# Patient Record
Sex: Female | Born: 1985 | Race: Black or African American | Hispanic: No | Marital: Single | Smoking: Current every day smoker
Health system: Southern US, Community
[De-identification: ages and names within clinical notes are randomized; demographics above are authoritative.]

## PROBLEM LIST (undated history)

## (undated) DIAGNOSIS — D649 Anemia, unspecified: Secondary | ICD-10-CM

## (undated) DIAGNOSIS — Z8742 Personal history of other diseases of the female genital tract: Secondary | ICD-10-CM

## (undated) DIAGNOSIS — Z8669 Personal history of other diseases of the nervous system and sense organs: Secondary | ICD-10-CM

## (undated) DIAGNOSIS — S82892A Other fracture of left lower leg, initial encounter for closed fracture: Secondary | ICD-10-CM

## (undated) DIAGNOSIS — S82891A Other fracture of right lower leg, initial encounter for closed fracture: Secondary | ICD-10-CM

## (undated) HISTORY — DX: Personal history of other diseases of the nervous system and sense organs: Z86.69

## (undated) HISTORY — DX: Anemia, unspecified: D64.9

---
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
## 2012-05-07 DIAGNOSIS — S82892A Other fracture of left lower leg, initial encounter for closed fracture: Secondary | ICD-10-CM

## 2012-05-07 HISTORY — DX: Other fracture of left lower leg, initial encounter for closed fracture: S82.892A

## 2016-05-07 DIAGNOSIS — Z8742 Personal history of other diseases of the female genital tract: Secondary | ICD-10-CM

## 2016-05-07 HISTORY — DX: Personal history of other diseases of the female genital tract: Z87.42

## 2017-06-07 DIAGNOSIS — S82891A Other fracture of right lower leg, initial encounter for closed fracture: Secondary | ICD-10-CM

## 2017-06-07 HISTORY — DX: Other fracture of right lower leg, initial encounter for closed fracture: S82.891A

## 2017-09-09 ENCOUNTER — Emergency Department: Payer: Medicaid Other

## 2017-09-09 ENCOUNTER — Encounter: Payer: Self-pay | Admitting: Emergency Medicine

## 2017-09-09 ENCOUNTER — Emergency Department
Admission: EM | Admit: 2017-09-09 | Discharge: 2017-09-09 | Disposition: A | Discharge status: Home / Self Care | Payer: Medicaid Other | Attending: Emergency Medicine | Admitting: Emergency Medicine

## 2017-09-09 DIAGNOSIS — Y998 Other external cause status: Secondary | ICD-10-CM | POA: Insufficient documentation

## 2017-09-09 DIAGNOSIS — S99911A Unspecified injury of right ankle, initial encounter: Secondary | ICD-10-CM | POA: Diagnosis present

## 2017-09-09 DIAGNOSIS — Y929 Unspecified place or not applicable: Secondary | ICD-10-CM | POA: Diagnosis not present

## 2017-09-09 DIAGNOSIS — Y9351 Activity, roller skating (inline) and skateboarding: Secondary | ICD-10-CM | POA: Insufficient documentation

## 2017-09-09 DIAGNOSIS — S82831A Other fracture of upper and lower end of right fibula, initial encounter for closed fracture: Secondary | ICD-10-CM | POA: Insufficient documentation

## 2017-09-09 DIAGNOSIS — W010XXA Fall on same level from slipping, tripping and stumbling without subsequent striking against object, initial encounter: Secondary | ICD-10-CM | POA: Insufficient documentation

## 2017-09-09 MED ORDER — IBUPROFEN 800 MG PO TABS: 800.0000 mg | ORAL_TABLET | Freq: Three times a day (TID) | ORAL | 0 refills | Status: DC | PRN

## 2017-09-09 NOTE — ED Notes (Signed)
See triage note  Presents with pain and swelling to right ankle   States she twisted ankle about 2 week ago  conts to have pain  Good pulses  No deformity noted

## 2017-09-09 NOTE — ED Triage Notes (Signed)
Pt reports sprained her right ankle a couple of weeks ago skating and now both of her heels hurt. Pt reports hurts bad when she gets up from sitting to start walking on the heel of both of her feet. Denies recent injuries.

## 2017-09-09 NOTE — ED Provider Notes (Signed)
[facility] Emergency Department Provider Note  ____________________________________________  Time seen: Approximately 12:32 PM  I have reviewed the triage vital signs and the nursing notes.   HISTORY  Chief Complaint Ankle Pain    HPI Nicole Craig is a 32 y.o. female  that presents to the emergency department for evaluation of right ankle pain for 2 weeks after falling while skating.  Patient was not evaluated after fall because she had to work.  Pain is primarily on the outside of her ankle but radiates into her heels.  No numbness, tingling.  History reviewed. No pertinent past medical history.  There are no active problems to display for this patient.   History reviewed. No pertinent surgical history.  Prior to Admission medications   Medication Sig Start Date End Date Taking? Authorizing Provider  ibuprofen (ADVIL,MOTRIN) 800 MG tablet Take 1 tablet (800 mg total) by mouth every 8 (eight) hours as needed. 09/09/17   Enid Derry, PA-C    Allergies Patient has no known allergies.  No family history on file.  Social History Social History   Tobacco Use  . Smoking status: Not on file  Substance Use Topics  . Alcohol use: Not on file  . Drug use: Not on file     Review of Systems  Cardiovascular: No chest pain. Respiratory: No SOB. Gastrointestinal: No abdominal pain.  No nausea, no vomiting.  Musculoskeletal: Positive for ankle pain.  Skin: Negative for rash, abrasions, lacerations, ecchymosis. Neurological: Negative for numbness or tingling   ____________________________________________   PHYSICAL EXAM:  VITAL SIGNS: ED Triage Vitals [09/09/17 1053]  Enc Vitals Group     BP 114/76     Pulse Rate 69     Resp 20     Temp 98.1 F (36.7 C)     Temp Source Oral     SpO2 100 %     Weight 215 lb (97.5 kg)     Height  (1.626 m)     Head Circumference      Peak Flow      Pain Score 7     Pain Loc      Pain Edu?       Excl. in GC?      Constitutional: Alert and oriented. Well appearing and in no acute distress. Eyes: Conjunctivae are normal. PERRL. EOMI. Head: Atraumatic. ENT:      Ears:      Nose: No congestion/rhinnorhea.      Mouth/Throat: Mucous membranes are moist.  Neck: No stridor.  Cardiovascular: Normal rate, regular rhythm.  Good peripheral circulation. Respiratory: Normal respiratory effort without tachypnea or retractions. Lungs CTAB. Good air entry to the bases with no decreased or absent breath sounds. Musculoskeletal: Full range of motion to all extremities. No gross deformities appreciated.  Tenderness to palpation of her right lateral malleolus.  No swelling or erythema. Neurologic:  Normal speech and language. No gross focal neurologic deficits are appreciated.  Skin:  Skin is warm, dry and intact. No rash noted. Psychiatric: Mood and affect are normal. Speech and behavior are normal. Patient exhibits appropriate insight and judgement.   ____________________________________________   LABS (all labs ordered are listed, but only abnormal results are displayed)  Labs Reviewed - No data to display ____________________________________________  EKG   ____________________________________________  RADIOLOGY Lexine Baton, personally viewed and evaluated these images (plain radiographs) as part of my medical decision making, as well as reviewing the written report by the radiologist.  Dg Ankle  Complete Right  Result Date: 09/09/2017 CLINICAL DATA:  Sprained ankle couple weeks ago.  Both heels hurt. EXAM: RIGHT ANKLE - COMPLETE 3+ VIEW COMPARISON:  None. FINDINGS: There is a fracture through the distal tip of the fibula, likely an avulsion injury from recent sprain. No other fractures. Specifically, the calcaneus maintains its normal shape. Soft tissue swelling identified. IMPRESSION: A fracture off the distal tip of the fibula is likely an avulsion injury and appears to be  acute to subacute. There is soft tissue swelling. No other abnormalities. Electronically Signed   By: Gerome Sam III M.D   On: 09/09/2017 11:50    ____________________________________________    PROCEDURES  Procedure(s) performed:    Procedures    Medications - No data to display   ____________________________________________   INITIAL IMPRESSION / ASSESSMENT AND PLAN / ED COURSE  Pertinent labs & imaging results that were available during my care of the patient were reviewed by me and considered in my medical decision making (see chart for details).  Review of the [place] CSRS was performed in accordance of the [facility] prior to dispensing any controlled drugs.   Patient presented to emergency department for evaluation of ankle pain for 2 weeks after injury.  Patient's diagnosis is consistent with avulsion fracture of the distal fibula.  Vital signs and exam are reassuring.  X-ray consistent with acute or subacute fracture.  Splint was placed.  Crutches were given.  Patient is to follow up with orthopedics as directed. Patient is given ED precautions to return to the ED for any worsening or new symptoms.     ____________________________________________  FINAL CLINICAL IMPRESSION(S) / ED DIAGNOSES  Final diagnoses:  Other closed fracture of distal end of right fibula, initial encounter      NEW MEDICATIONS STARTED DURING THIS VISIT:  ED Discharge Orders        Ordered    ibuprofen (ADVIL,MOTRIN) 800 MG tablet  Every 8 hours PRN     09/09/17 1259          This chart was dictated using voice recognition software/Dragon. Despite best efforts to proofread, errors can occur which can change the meaning. Any change was purely unintentional.    Enid Derry, PA-C 09/09/17 1553    Merrily Brittle, MD 09/09/17 [phone]

## 2018-01-25 ENCOUNTER — Emergency Department: Payer: Medicaid Other

## 2018-01-25 ENCOUNTER — Other Ambulatory Visit: Payer: Self-pay

## 2018-01-25 ENCOUNTER — Encounter: Payer: Self-pay | Admitting: Emergency Medicine

## 2018-01-25 ENCOUNTER — Emergency Department
Admission: EM | Admit: 2018-01-25 | Discharge: 2018-01-25 | Disposition: A | Discharge status: Home / Self Care | Payer: Medicaid Other | Attending: Emergency Medicine | Admitting: Emergency Medicine

## 2018-01-25 DIAGNOSIS — F17210 Nicotine dependence, cigarettes, uncomplicated: Secondary | ICD-10-CM | POA: Insufficient documentation

## 2018-01-25 DIAGNOSIS — L03211 Cellulitis of face: Secondary | ICD-10-CM

## 2018-01-25 DIAGNOSIS — Z98818 Other dental procedure status: Secondary | ICD-10-CM | POA: Diagnosis present

## 2018-01-25 LAB — BASIC METABOLIC PANEL
ANION GAP: 10 (ref 5–15)
BUN: 11 mg/dL (ref 6–20)
CALCIUM: 8.8 mg/dL — AB (ref 8.9–10.3)
CO2: 26 mmol/L (ref 22–32)
Chloride: 102 mmol/L (ref 98–111)
Creatinine, Ser: 0.72 mg/dL (ref 0.44–1.00)
GFR calc Af Amer: >60 mL/min (ref >60)
GFR calc non Af Amer: >60 mL/min (ref >60)
Glucose, Bld: 100 mg/dL — ABNORMAL HIGH (ref 70–99)
POTASSIUM: 3.9 mmol/L (ref 3.5–5.1)
Sodium: 138 mmol/L (ref 135–145)

## 2018-01-25 LAB — CBC WITH DIFFERENTIAL/PLATELET
BASOS ABS: 0 10*3/uL (ref 0–0.1)
Basophils Relative: 0 %
Eosinophils Absolute: 0.2 10*3/uL (ref 0–0.7)
Eosinophils Relative: 2 %
HEMATOCRIT: 38.1 % (ref 35.0–47.0)
Hemoglobin: 12.7 g/dL (ref 12.0–16.0)
LYMPHS ABS: 1.6 10*3/uL (ref 1.0–3.6)
LYMPHS PCT: 18 %
MCH: 26.7 pg (ref 26.0–34.0)
MCHC: 33.4 g/dL (ref 32.0–36.0)
MCV: 80 fL (ref 80.0–100.0)
MONO ABS: 0.7 10*3/uL (ref 0.2–0.9)
Monocytes Relative: 8 %
NEUTROS ABS: 6.6 10*3/uL — AB (ref 1.4–6.5)
Neutrophils Relative %: 72 %
Platelets: 181 10*3/uL (ref 150–440)
RBC: 4.76 MIL/uL (ref 3.80–5.20)
RDW: 15.2 % — AB (ref 11.5–14.5)
WBC: 9.1 10*3/uL (ref 3.6–11.0)

## 2018-01-25 LAB — POCT PREGNANCY, URINE: Preg Test, Ur: NEGATIVE

## 2018-01-25 MED ORDER — CLINDAMYCIN HCL 300 MG PO CAPS: 300.0000 mg | ORAL_CAPSULE | Freq: Three times a day (TID) | ORAL | 0 refills | Status: AC

## 2018-01-25 MED ORDER — OXYCODONE-ACETAMINOPHEN 5-325 MG PO TABS: 1.0000 | ORAL_TABLET | ORAL | 0 refills | Status: DC | PRN

## 2018-01-25 MED ORDER — IOHEXOL 300 MG/ML  SOLN
75.0000 mL | Freq: Once | INTRAMUSCULAR | Status: AC | PRN
  Administered 2018-01-25: 75 mL via INTRAVENOUS

## 2018-01-25 MED ORDER — PREDNISONE 20 MG PO TABS: 60.0000 mg | ORAL_TABLET | Freq: Every day | ORAL | 0 refills | Status: DC

## 2018-01-25 MED ORDER — CLINDAMYCIN PHOSPHATE 600 MG/50ML IV SOLN
600.0000 mg | Freq: Once | INTRAVENOUS | Status: AC
  Administered 2018-01-25: 600 mg via INTRAVENOUS
  Filled 2018-01-25 (×2): qty 50

## 2018-01-25 MED ORDER — DEXAMETHASONE SODIUM PHOSPHATE 10 MG/ML IJ SOLN
10.0000 mg | Freq: Once | INTRAMUSCULAR | Status: AC
  Administered 2018-01-25: 10 mg via INTRAVENOUS
  Filled 2018-01-25: qty 1

## 2018-01-25 NOTE — ED Notes (Signed)
Obvious swelling noted to left side of face. Maintaining airway at this time.

## 2018-01-25 NOTE — ED Triage Notes (Signed)
FIRST NURSE NOTE-had wisdom teeth removed yesterday. Significant swelling to face. +chills and dizziness. No abx from dental surgery. Handling secretions.

## 2018-01-25 NOTE — ED Notes (Signed)
Green blood tube recollect sent to lab. Pending analysis.

## 2018-01-25 NOTE — ED Notes (Signed)
Will monitor pt shortly following injection for adverse reactions. Will d/c home following.

## 2018-01-25 NOTE — ED Provider Notes (Signed)
[facility] Emergency Department Provider Note  ____________________________________________   I have reviewed the triage vital signs and the nursing notes. Where available I have reviewed prior notes and, if possible and indicated, outside hospital notes.    HISTORY  Chief Complaint Facial Swelling    HPI Nicole Craig is a 32 y.o. female who is healthy, [facility] [place] to have her wisdom teeth pulled out.  She had a pulled out, she is had some mild swelling since that time the swelling got worse overnight.  Has not been able to get in touch with oral maxillofacial surgeon, and states that she cannot get in touch with them on the weekend.  She denies any swelling under her tongue she denies any airway issues, but the swelling is getting worse and it is pushing her cheek up towards her eye.  She denies any vomiting she denies any fever or other associated symptoms is worse when she touches it nothing makes it better, no other alleviating or aggravating symptoms no other prior intervention except for Motrin.     History reviewed. No pertinent past medical history.  There are no active problems to display for this patient.   History reviewed. No pertinent surgical history.  Prior to Admission medications   Medication Sig Start Date End Date Taking? Authorizing Provider  ibuprofen (ADVIL,MOTRIN) 800 MG tablet Take 1 tablet (800 mg total) by mouth every 8 (eight) hours as needed. 09/09/17   Enid DerryWagner, Ashley, PA-C    Allergies Patient has no known allergies.  No family history on file.  Social History Social History   Tobacco Use  . Smoking status: Current Every Day Smoker    Packs/day: 0.25    Types: Cigarettes  . Smokeless tobacco: Never Used  Substance Use Topics  . Alcohol use: Not on file  . Drug use: Not on file    Review of Systems Constitutional: No fever/chills Eyes: No  visual changes. ENT: No sore throat. No stiff neck no neck pain Cardiovascular: Denies chest pain. Respiratory: Denies shortness of breath. Gastrointestinal:   no vomiting.  No diarrhea.  No constipation. Genitourinary: Negative for dysuria. Musculoskeletal: Negative lower extremity swelling Skin: Negative for rash. Neurological: Negative for severe headaches, focal weakness or numbness.   ____________________________________________   PHYSICAL EXAM:  VITAL SIGNS: ED Triage Vitals  Enc Vitals Group     BP 01/25/18 1117 (!) 112/57     Pulse Rate 01/25/18 1117 84     Resp 01/25/18 1117 18     Temp 01/25/18 1117 99 F (37.2 C)     Temp Source 01/25/18 1117 Oral     SpO2 01/25/18 1117 97 %     Weight 01/25/18 1117 213 lb (96.6 kg)     Height 01/25/18 1117 5\' 4"  (1.626 m)     Head Circumference --      Peak Flow --      Pain Score 01/25/18 1121 8     Pain Loc --      Pain Edu? --      Excl. in GC? --     Constitutional: Alert and oriented. Well appearing and in no acute distress. Eyes: Conjunctivae are normal Head: Atraumatic HEENT: No congestion/rhinnorhea. Mucous membranes are moist.  Oropharynx non-erythematous there is swelling to the left cheek, which goes up to but does not include the orbits.  It is not erythematous but it is tender to touch.  There  is no fluctuance or induration that I can palpate.  Is no swelling underneath the tongue, there is no hoarse voice there is no difficulty swallowing there is no stridor or difficulty with respiration, oropharynx is clear.   There is some mild induration but no fluctuance noted to palpation of the left cheek near the mandibular angle. Neck:   Nontender with no meningismus, no masses, no stridor Cardiovascular: Normal rate, regular rhythm. Grossly normal heart sounds.  Good peripheral circulation. Respiratory: Normal respiratory effort.  No retractions. Lungs CTAB. Abdominal: Soft and nontender. No distention. No guarding no  rebound Back:  There is no focal tenderness or step off.  there is no midline tenderness there are no lesions noted. there is no CVA tenderness Musculoskeletal: No lower extremity tenderness, no upper extremity tenderness. No joint effusions, no DVT signs strong distal pulses no edema Neurologic:  Normal speech and language. No gross focal neurologic deficits are appreciated.  Skin:  Skin is warm, dry and intact. No rash noted. Psychiatric: Mood and affect are normal. Speech and behavior are normal.  ____________________________________________   LABS (all labs ordered are listed, but only abnormal results are displayed)  Labs Reviewed - No data to display  Pertinent labs  results that were available during my care of the patient were reviewed by me and considered in my medical decision making (see chart for details). ____________________________________________  EKG  I personally interpreted any EKGs ordered by me or triage  ____________________________________________  RADIOLOGY  Pertinent labs & imaging results that were available during my care of the patient were reviewed by me and considered in my medical decision making (see chart for details). If possible, patient and/or family made aware of any abnormal findings.  No results found. ____________________________________________    PROCEDURES  Procedure(s) performed: None  Procedures  Critical Care performed: None  ____________________________________________   INITIAL IMPRESSION / ASSESSMENT AND PLAN / ED COURSE  Pertinent labs & imaging results that were available during my care of the patient were reviewed by me and considered in my medical decision making (see chart for details).  And is here with swelling after an outpatient procedure.  I have tried calling the [facility], where this was done, as this is a complication of their procedure.  I cannot find any after hours number for the surgeons and  their number go straight to a voicemail box with no indication of when phone calls will be returned.  Did leave message at 29; there is nothing on their voicemail about after hours coverage.  Do not see any evidence on their website of after hours coverage either, therefore, we will continue to treat the patient without guidance from the actual surgeon who did the procedure, unless they call.  Patient postop swelling after her wisdom teeth being pulled out.  No evidence of systemic illness.  She is nontoxic in appearance with no evidence of high fevers or systemic illness, she is not known to be diabetic.  Given the postoperative infection complaint, we will start her on clindamycin. There is no evidence of Ludwig's or airway involvement.  There is some mild induration we will see if a CT betrays anything that requires drainage, and we will reassess.  ----------------------------------------- 4:18 PM on 01/25/2018 -----------------------------------------  Apparently not possible to get on the phone the people who actually did this procedure on the weekend, I did discuss with Dr. Genevive Bi of ENT, I very much appreciate the consult we discussed the patient's findings including CT  findings.  He agrees with steroids, antibiotics and close follow-up with the ENT on Monday which we have explained to the patient.  She will bring the results of the CT scan with her which I have provided her.  Return precautions follow-up given understood.  Also try to talk to oral maxillofacial surgery at [facility] given that some of the members of the practice that performed the procedure on this patient are at [facility], however, instead I got ENT as oral maxillofacial surgery was "not on for face".  We therefore deferred further consultation and will discharge the patient who is in no acute distress at this time    ____________________________________________   FINAL CLINICAL IMPRESSION(S) / ED DIAGNOSES  Final diagnoses:  None       This chart was dictated using voice recognition software.  Despite best efforts to proofread,  errors can occur which can change meaning.      Jeanmarie Plant, MD 01/25/18 1620

## 2018-01-25 NOTE — ED Notes (Signed)
Pending consult phone call back per MD. Will d/c once given orders.

## 2018-01-25 NOTE — Discharge Instructions (Addendum)
Take antibiotics as directed and the pain medications as needed, follow closely with the oral maxillofacial surgeons who performed the procedure on you.  Please let them know that there were the following outcomes from the procedure as seen on CT scan (see below); inform them that you had to go to the emergency room over the weekend in the emergency room and that the ER staff were  unable to contact any of the physicians who work in their practice and they could not find an after hours number listed, please inform them that it would be good idea to send you home with an after hours number if they have one, especially after doing a procedure on you.   My suggestion is that you go directly there on Monday morning for reevaluation.  Please bring this piece of paper with you and show it to the physician.  IMPRESSION: BILATERAL lateral and posterior wall maxillary fractures both at the site of maxillary wisdom tooth extraction, as well as more superiorly involving the maxillary sinuses.   Slight cortical dehiscence of the medial LEFT mandible, but not the RIGHT.   Fluid and air in the retroantral soft tissues, greater on the LEFT. Facial soft tissue swelling without abscess. Air-fluid level LEFT maxillary sinus.  Take the antibiotics until they are gone, do not drive on pain medications.   Radiodense abnormalities in the anterior lower face and lip, are suspected to be unrelated, and are consistent with the appearance of someone who has had lip Fillers.

## 2018-01-25 NOTE — ED Triage Notes (Signed)
Pt arrived via POV with reports of swelling to left side of the face, pt had wisdom teeth pulled on Wednesday.  Pt noticed the swelling worse today since yesterday.  Pt states the side of her face was swollen yesterday, but this morning more swelling noted to around the eye.

## 2018-04-17 ENCOUNTER — Emergency Department
Admission: EM | Admit: 2018-04-17 | Discharge: 2018-04-17 | Disposition: A | Discharge status: Home / Self Care | Payer: Medicaid Other | Attending: Emergency Medicine | Admitting: Emergency Medicine

## 2018-04-17 ENCOUNTER — Other Ambulatory Visit: Payer: Self-pay

## 2018-04-17 ENCOUNTER — Encounter: Payer: Self-pay | Admitting: Emergency Medicine

## 2018-04-17 DIAGNOSIS — O99331 Smoking (tobacco) complicating pregnancy, first trimester: Secondary | ICD-10-CM | POA: Insufficient documentation

## 2018-04-17 DIAGNOSIS — O9989 Other specified diseases and conditions complicating pregnancy, childbirth and the puerperium: Secondary | ICD-10-CM | POA: Diagnosis present

## 2018-04-17 DIAGNOSIS — Z3A01 Less than 8 weeks gestation of pregnancy: Secondary | ICD-10-CM | POA: Diagnosis not present

## 2018-04-17 DIAGNOSIS — O210 Mild hyperemesis gravidarum: Secondary | ICD-10-CM

## 2018-04-17 DIAGNOSIS — F17210 Nicotine dependence, cigarettes, uncomplicated: Secondary | ICD-10-CM | POA: Insufficient documentation

## 2018-04-17 LAB — URINALYSIS, COMPLETE (UACMP) WITH MICROSCOPIC
Bacteria, UA: NONE SEEN
Bilirubin Urine: NEGATIVE
GLUCOSE, UA: NEGATIVE mg/dL
Hgb urine dipstick: NEGATIVE
KETONES UR: 80 mg/dL — AB
Leukocytes, UA: NEGATIVE
NITRITE: NEGATIVE
PH: 6 (ref 5.0–8.0)
Protein, ur: NEGATIVE mg/dL
Specific Gravity, Urine: 1.023 (ref 1.005–1.030)

## 2018-04-17 MED ORDER — SODIUM CHLORIDE 0.9 % IV BOLUS
1000.0000 mL | Freq: Once | INTRAVENOUS | Status: AC
  Administered 2018-04-17: 1000 mL via INTRAVENOUS

## 2018-04-17 MED ORDER — ONDANSETRON 4 MG PO TBDP: 4.0000 mg | ORAL_TABLET | Freq: Three times a day (TID) | ORAL | 0 refills | Status: DC | PRN

## 2018-04-17 MED ORDER — ONDANSETRON HCL 4 MG/2ML IJ SOLN
4.0000 mg | Freq: Once | INTRAMUSCULAR | Status: AC
  Administered 2018-04-17: 4 mg via INTRAVENOUS
  Filled 2018-04-17: qty 2

## 2018-04-17 NOTE — ED Notes (Signed)
Patient going to restroom to give urine sample at this time

## 2018-04-17 NOTE — ED Provider Notes (Signed)
[facility] Emergency Department Provider Note  ____________________________________________   None    (approximate)  I have reviewed the triage vital signs and the nursing notes.   HISTORY  Chief Complaint Morning Sickness and Abdominal Cramping   HPI Nicole Craig is a 32 y.o. female presents to the ED via EMS with history of nausea and vomiting.  Patient is [redacted] weeks pregnant.  She complains of dizziness along with "hot flashes".  She is also had some abdominal cramping but no vaginal bleeding or discomfort.  She is scheduled for a [facility] next [facility].  She states that she has vomited approximately 6 times in 24 hours.  This is her sixth pregnancy.  She rates her pain as 2 out of 10.  History reviewed. No pertinent past medical history.  There are no active problems to display for this patient.   History reviewed. No pertinent surgical history.  Prior to Admission medications   Medication Sig Start Date End Date Taking? Authorizing Provider  ondansetron (ZOFRAN ODT) 4 MG disintegrating tablet Take 1 tablet (4 mg total) by mouth every 8 (eight) hours as needed for nausea or vomiting. 04/17/18   Tommi RumpsSummers, Rhonda L, PA-C    Allergies Patient has no known allergies.  No family history on file.  Social History Social History   Tobacco Use  . Smoking status: Current Every Day Smoker    Packs/day: 0.25    Types: Cigarettes  . Smokeless tobacco: Never Used  Substance Use Topics  . Alcohol use: Not on file  . Drug use: Not on file    Review of Systems Constitutional: No fever/chills Eyes: No visual changes.  Positive for dizziness. ENT: No sore throat. Cardiovascular: Denies chest pain. Respiratory: Denies shortness of breath. Gastrointestinal: Positive abdominal cramping.  Positive nausea, positive vomiting.  No diarrhea.  Genitourinary: Negative for dysuria. Musculoskeletal: Negative for back  pain. Skin: Negative for rash. Neurological: Negative for headaches, focal weakness or numbness. ____________________________________________   PHYSICAL EXAM:  VITAL SIGNS: ED Triage Vitals  Enc Vitals Group     BP 04/17/18 0859 109/61     Pulse Rate 04/17/18 0859 (!) 110     Resp 04/17/18 0859 20     Temp 04/17/18 0859 98.5 F (36.9 C)     Temp Source 04/17/18 0859 Oral     SpO2 04/17/18 0859 100 %     Weight 04/17/18 0900 213 lb (96.6 kg)     Height 04/17/18 0900 5\' 3"  (1.6 m)     Head Circumference --      Peak Flow --      Pain Score 04/17/18 0900 2     Pain Loc --      Pain Edu? --      Excl. in GC? --    Constitutional: Alert and oriented. Well appearing and in no acute distress. Eyes: Conjunctivae are normal.  Head: Atraumatic. Nose: No congestion/rhinnorhea. Mouth/Throat: Mucous membranes are moist.  Oropharynx non-erythematous. Neck: No stridor.   Hematological/Lymphatic/Immunilogical: No cervical lymphadenopathy. Cardiovascular: Normal rate, regular rhythm. Grossly normal heart sounds.  Good peripheral circulation. Respiratory: Normal respiratory effort.  No retractions. Lungs CTAB. Gastrointestinal: Soft and nontender. No distention.  Bowel sounds are normoactive x4 quadrants. Musculoskeletal: Moves upper and lower extremities without any difficulty.  Patient is able to stand without any assistance.  No joint swelling is present. Neurologic:  Normal speech and language. No gross focal neurologic deficits are appreciated. No gait instability. Skin:  Skin is warm, dry and intact. No rash noted. Psychiatric: Mood and affect are normal. Speech and behavior are normal.  ____________________________________________   LABS (all labs ordered are listed, but only abnormal results are displayed)  Labs Reviewed  URINALYSIS, COMPLETE (UACMP) WITH MICROSCOPIC - Abnormal; Notable for the following components:      Result Value   Color, Urine YELLOW (*)    APPearance  CLEAR (*)    Ketones, ur 80 (*)    All other components within normal limits   PROCEDURES  Procedure(s) performed: None  Procedures  Critical Care performed: No  ____________________________________________   INITIAL IMPRESSION / ASSESSMENT AND PLAN / ED COURSE  As part of my medical decision making, I reviewed the following data within the electronic MEDICAL RECORD NUMBER Notes from prior ED visits and [place] Controlled Substance Database  Patient presents to the ED with complaint of vomiting being [redacted] weeks pregnant.  Patient denies any vaginal discharge or bleeding.  She complains of some abdominal cramping and has been having dry heaves.  She also feels dizzy and "hot flashes".  Patient is scheduled for her prenatal visit at the health department next week.  Patient was noted to be spitting into an emesis bag but was never seen actually vomiting.  She was given normal saline 1 L after orthostatic vital signs were taken.  Zofran IV was given and patient improved.  At the time of discharge patient was improved with nausea.  She was given a prescription for Zofran to continue and encouraged to discontinue any greasy, fried, spicy foods at this time.  She is encouraged also to keep her appointment with the health department.  ____________________________________________   FINAL CLINICAL IMPRESSION(S) / ED DIAGNOSES  Final diagnoses:  Hyperemesis arising during pregnancy     ED Discharge Orders         Ordered    ondansetron (ZOFRAN ODT) 4 MG disintegrating tablet  Every 8 hours PRN     04/17/18 1144           Note:  This document was prepared using Dragon voice recognition software and may include unintentional dictation errors.    Tommi Rumps, PA-C 04/17/18 1656    Schaevitz, Myra Rude, MD 04/18/18 [phone]

## 2018-04-17 NOTE — Discharge Instructions (Signed)
Keep your point with the help apartment next week.  Begin taking Zofran 1 every 8 hours as needed for nausea.  You may also drink ginger ale which may calm your stomach.  Take small sips of clear liquids.  Avoid fatty greasy foods or fried foods at this time.

## 2018-04-17 NOTE — ED Triage Notes (Signed)
Presents via EMS   States she is 6 weeks preg and is having some n/v   Vomiting started yesterday  But now having dry heaves  Also has been dizzy and having some "hot flashes"  States she is also having some abd cramping w/o bleeding  scheduled to got to [facility] next for [facility] care

## 2018-04-17 NOTE — ED Notes (Signed)
Unable to stand and have B/P taken. She was to light headed and nauseated. Third B/P check she was back laying down.  111/82, H/R 88

## 2018-04-22 LAB — OB RESULTS CONSOLE VARICELLA ZOSTER ANTIBODY, IGG: Varicella: IMMUNE

## 2018-04-22 LAB — TSH: TSH: 0.53 (ref 0.41–5.90)

## 2018-04-22 LAB — OB RESULTS CONSOLE RUBELLA ANTIBODY, IGM: Rubella: IMMUNE

## 2018-04-22 LAB — BASIC METABOLIC PANEL: Creatinine: 0.5 (ref 0.5–1.1)

## 2018-04-22 LAB — OB RESULTS CONSOLE HIV ANTIBODY (ROUTINE TESTING): HIV: NONREACTIVE

## 2018-04-22 LAB — HEPATIC FUNCTION PANEL: AST: 15 (ref 13–35)

## 2018-04-22 LAB — CBC AND DIFFERENTIAL: Hemoglobin: 12.1 (ref 12.0–16.0)

## 2018-04-23 LAB — OB RESULTS CONSOLE ABO/RH: RH Type: POSITIVE

## 2018-04-23 LAB — OB RESULTS CONSOLE ANTIBODY SCREEN: Antibody Screen: NEGATIVE

## 2018-04-23 LAB — OB RESULTS CONSOLE HGB/HCT, BLOOD: HCT: 37 (ref 29–41)

## 2018-04-23 LAB — OB RESULTS CONSOLE HEPATITIS B SURFACE ANTIGEN: Hepatitis B Surface Ag: NEGATIVE

## 2018-04-23 LAB — OB RESULTS CONSOLE PLATELET COUNT: Platelets: 186

## 2018-04-23 LAB — OB RESULTS CONSOLE RPR: RPR: NONREACTIVE

## 2018-04-24 ENCOUNTER — Other Ambulatory Visit: Payer: Self-pay | Admitting: Nurse Practitioner

## 2018-04-24 DIAGNOSIS — Z369 Encounter for antenatal screening, unspecified: Secondary | ICD-10-CM

## 2018-04-24 LAB — OB RESULTS CONSOLE GC/CHLAMYDIA
Chlamydia: NEGATIVE
Gonorrhea: NEGATIVE

## 2018-05-07 NOTE — L&D Delivery Note (Signed)
Delivery Note At 3:05 AM a viable and healthy female "Nicole Craig" was delivered via Vaginal, Spontaneous (Presentation:OA ).  APGAR: 9, 9; weight pending .   Placenta status: expressed.  Cord: 3VC with the following complications: nuchal x1, loose  Anesthesia:  epidural Episiotomy: None Lacerations: None Est. Blood Loss (mL):  100  Mom to postpartum.  Baby to Couplet care / Skin to Skin.  33yo [id number] at 40+1wks with iol at term, elective. She was AROM for clear fluid x8.5hrs. Deep repetitive variable with contractions with reassuringly moderate variability and accels, but conservative measures did not persistently work. Pitocin was turned off and on intermittently. She progressed to anterior lip, which was able to be reduced. Pt pushed from a -1 station to +3 in just a few contractions, and then delivered over an intact perineum. Cord was reduced on the perineum. Baby was immediately vigorous and placed on maternal abdomen. Delayed cord clamping and grandmother cut the cord. Placenta expressed. Trailing membranes teased out from firm attachment at the fundus. Bleeding minimal. No tears. Postpartum pit given. Patient tolerated well.  Benjaman Kindler 11/20/2018, 3:30 AM

## 2018-05-15 ENCOUNTER — Ambulatory Visit (HOSPITAL_BASED_OUTPATIENT_CLINIC_OR_DEPARTMENT_OTHER)
Admission: RE | Admit: 2018-05-15 | Discharge: 2018-05-15 | Disposition: A | Discharge status: Home / Self Care | Payer: Medicaid Other | Source: Ambulatory Visit | Attending: Obstetrics and Gynecology | Admitting: Obstetrics and Gynecology

## 2018-05-15 ENCOUNTER — Ambulatory Visit
Admission: RE | Admit: 2018-05-15 | Discharge: 2018-05-15 | Disposition: A | Discharge status: Home / Self Care | Payer: Medicaid Other | Source: Ambulatory Visit | Attending: Obstetrics and Gynecology | Admitting: Obstetrics and Gynecology

## 2018-05-15 ENCOUNTER — Inpatient Hospital Stay: Payer: Medicaid Other | Attending: Oncology | Admitting: Oncology

## 2018-05-15 VITALS — BP 120/70 | HR 76 | Temp 98.1°F | Resp 18 | Wt 205.0 lb

## 2018-05-15 DIAGNOSIS — Z3A13 13 weeks gestation of pregnancy: Secondary | ICD-10-CM | POA: Diagnosis not present

## 2018-05-15 DIAGNOSIS — Z8279 Family history of other congenital malformations, deformations and chromosomal abnormalities: Secondary | ICD-10-CM | POA: Diagnosis not present

## 2018-05-15 DIAGNOSIS — Q690 Accessory finger(s): Secondary | ICD-10-CM

## 2018-05-15 DIAGNOSIS — Q691 Accessory thumb(s): Secondary | ICD-10-CM

## 2018-05-15 DIAGNOSIS — Z315 Encounter for genetic counseling: Secondary | ICD-10-CM

## 2018-05-15 DIAGNOSIS — Z369 Encounter for antenatal screening, unspecified: Secondary | ICD-10-CM

## 2018-05-15 DIAGNOSIS — Z3682 Encounter for antenatal screening for nuchal translucency: Secondary | ICD-10-CM | POA: Insufficient documentation

## 2018-05-15 DIAGNOSIS — Q692 Accessory toe(s): Secondary | ICD-10-CM

## 2018-05-15 NOTE — Progress Notes (Addendum)
Referring physician:  [facility] Department Length of Consultation: 40 minutes   Nicole Craig  was referred to [facility] of  for genetic counseling to review prenatal screening and testing options.  We also reviewed the personal and family history of polydactyly and her recent hemoglobinopathy evaluation results.  This note summarizes the information we discussed.    We offered the following routine screening tests for this pregnancy:  First trimester screening, which includes nuchal translucency ultrasound screen and first trimester maternal serum marker screening.  The nuchal translucency has approximately an 80% detection rate for Down syndrome and can be positive for other chromosome abnormalities as well as congenital heart defects.  When combined with a maternal serum marker screening, the detection rate is up to 90% for Down syndrome and up to 97% for trisomy 18.     Maternal serum marker screening, a blood test that measures pregnancy proteins, can provide risk assessments for Down syndrome, trisomy 18, and open neural tube defects (spina bifida, anencephaly). Because it does not directly examine the fetus, it cannot positively diagnose or rule out these problems.  Targeted ultrasound uses high frequency sound waves to create an image of the developing fetus.  An ultrasound is often recommended as a routine means of evaluating the pregnancy.  It is also used to screen for fetal anatomy problems (for example, a heart defect) that might be suggestive of a chromosomal or other abnormality.   Should these screening tests indicate an increased concern, then the following additional testing options would be offered:  The chorionic villus sampling procedure is available for first trimester chromosome analysis.  This involves the withdrawal of a small amount of chorionic villi (tissue from the developing placenta).  Risk of pregnancy loss is estimated to be  approximately 1 in 200 to 1 in 100 (0.5 to 1%).  There is approximately a 1% (1 in 100) chance that the CVS chromosome results will be unclear.  Chorionic villi cannot be tested for neural tube defects.     Amniocentesis involves the removal of a small amount of amniotic fluid from the sac surrounding the fetus with the use of a thin needle inserted through the maternal abdomen and uterus.  Ultrasound guidance is used throughout the procedure.  Fetal cells from amniotic fluid are directly evaluated and > 99.5% of chromosome problems and > 98% of open neural tube defects can be detected. This procedure is generally performed after the 15th week of pregnancy.  The main risks to this procedure include complications leading to miscarriage in less than 1 in 200 cases (0.5%).  As another option for information if the pregnancy is suspected to be an an increased chance for certain chromosome conditions, we also reviewed the availability of cell free fetal DNA testing from maternal blood to determine whether or not the baby may have either Down syndrome, trisomy 35, or trisomy 7.  This test utilizes a maternal blood sample and DNA sequencing technology to isolate circulating cell free fetal DNA from maternal plasma.  The fetal DNA can then be analyzed for DNA sequences that are derived from the three most common chromosomes involved in aneuploidy, chromosomes 13, 18, and 21.  If the overall amount of DNA is greater than the expected level for any of these chromosomes, aneuploidy is suspected.  While we do not consider it a replacement for invasive testing and karyotype analysis, a negative result from this testing would be reassuring, though not a guarantee of a  normal chromosome complement for the baby.  An abnormal result is certainly suggestive of an abnormal chromosome complement, though we would still recommend CVS or amniocentesis to confirm any findings from this testing.  Cystic Fibrosis and Spinal Muscular  Atrophy (SMA) screening were also discussed with the patient. Both conditions are recessive, which means that both parents must be carriers in order to have a child with the disease.  Cystic fibrosis (CF) is one of the most common genetic conditions in persons of Caucasian ancestry.  This condition occurs in approximately 1 in 2,500 Caucasian persons and results in thickened secretions in the lungs, digestive, and reproductive systems.  For a baby to be at risk for having CF, both of the parents must be carriers for this condition.  Approximately 1 in 40 Caucasian persons is a carrier for CF.  Current carrier testing looks for the most common mutations in the gene for CF and can detect approximately 90% of carriers in the Caucasian population.  This means that the carrier screening can greatly reduce, but cannot eliminate, the chance for an individual to have a child with CF.  If an individual is found to be a carrier for CF, then carrier testing would be available for the partner. As part of [place]'s newborn screening profile, all babies born in the state of [place] will have a two-tier screening process.  Specimens are first tested to determine the concentration of immunoreactive trypsinogen (IRT).  The top 5% of specimens with the highest IRT values then undergo DNA testing using a panel of over 40 common CF mutations. SMA is a neurodegenerative disorder that leads to atrophy of skeletal muscle and overall weakness.  This condition is also more prevalent in the Caucasian population, with 1 in 40-1 in 60 persons being a carrier and 1 in 6,000-1 in 10,000 children being affected.  There are multiple forms of the disease, with some causing death in infancy to other forms with survival into adulthood.  The genetics of SMA is complex, but carrier screening can detect up to 95% of carriers in the Caucasian population.  Similar to CF, a negative result can greatly reduce, but cannot eliminate, the chance  to have a child with SMA.  We obtained a detailed family history and pregnancy history. This is the sixth pregnancy for Nicole Craig, the third with her current partner. Together they have a 46 year old son who is in good health and had one early miscarriage.  She has two other sons (ages 21 and 1) and a 98 year old daughter from prior relationships.  All are in good health. The 57 year old son has been diagnosed with ADHD. Her husband, Nicole Craig, has an 68 year old daughter who is also healthy. Nicole Craig reported a personal and family history of polydactyly.  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 Her three older children all had polydactyly, as did her mother, 4 maternal aunts and many of her maternal cousins.  None of the individuals with polydactyly have any other birth differences or developmental concerns.  We reviewed that this in a fairly common trait, particularly in families of African American ancestry, and is inherited in an autosomal dominant manner.  This means that when a parent has the trait, each of their children has a 50% chance for inheriting the trait as well.  We can sometime detected polydactyly on second trimester ultrasound.  She also reported one maternal aunt with multiple pregnancy losses  and a baby who passed away very soon after delivery.  This baby is said to have had a cleft lip and possible brain malformations, and the mother was 33 years old at the time of delivery.  We reviewed that there may be various reasons for the features described, and it more information is learned about the reason for the miscarriages or for the diagnosis in the baby, we are happy to discuss this further.  Without additional medical information, it is difficult to assess the level of concern for this pregnancy or other family members. The remainder of the family history was reported to be unremarkable for birth defects, intellectual delays, recurrent pregnancy loss or known  chromosome abnormalities.  Nicole Craig reported no complications in this pregnancy other than nasuea and vomiting.  She reported no exposures that would be expected to increase the risk for birth defects.  A review of her prenatal labs showed that her hemoglobinopathy evaluation showed normal adult hemoglobin (A) with a slightly low hemoglobin A2 (1.5%).  Her MCV is low normal (80) and her hematocrit was normal. Per the lab report, a low A2 can be associated with iron deficiency or possibly alpha thalassemia.  The normal hematocrit makes the low iron less likely, though we cannot rule this out without a ferritin level.  The MCV value of 80, while technically normal, is on the low end of the normal range, which would go along with alpha or delta globin variants. We cannot rule out one of these possibilities without additional testing.  In the unlikely event she was a carrier of a variant, it would only have clinical concern for this pregnancy if the father other baby also carried a hemoglobin variant.  We could consider iron studies, hemoglobin cascade testing, or alpha globin gene testing if desired.  Testing the father of the baby could also be offered and may be a more direct determination of risks for this pregnancy, as if his testing is all normal then the pregnancy is not at risk for a clinically significant hemoglobinopathy.  In addition, newborn screening will test the baby at birth.  After consideration of the options, Nicole Craig elected to proceed with first trimester screening, CF and SMA carrier testing.  We did not performed iron studies or additional hemoglobinopathy evaluation today due normal hematocrit and MCV, but testing can be performed if she is concerned or more information is desired.  An ultrasound was performed at the time of the visit.  The gestational age was consistent with  13 weeks.  Fetal anatomy could not be assessed due to early gestational age.  Please refer to the  ultrasound report for details of that study.  Nicole Craig was encouraged to call with questions or concerns.  We can be contacted at [phone].  Labs ordered: first trimester screening, CF, SMA carrier testing  Cherly Andersoneborah F. Wells, MS, CGC  Katrina Stackeborah Wells, MS, CGC performed an integral service incident to the physician's initial service.  I was physically present in the clinical area and was immediately available to render assistance.   Kia Stavros C Aamari Strawderman

## 2018-05-19 ENCOUNTER — Telehealth: Payer: Self-pay | Admitting: Obstetrics and Gynecology

## 2018-05-19 NOTE — Telephone Encounter (Signed)
Nicole Craig  elected to undergo First Trimester screening on 05/15/2018.  To review, first trimester screening, includes nuchal translucency ultrasound screen and/or first trimester maternal serum marker screening.  The nuchal translucency has approximately an 80% detection rate for Down syndrome and can be positive for other chromosome abnormalities as well as heart defects.  When combined with a maternal serum marker screening, the detection rate is up to 90% for Down syndrome and up to 97% for trisomy 13 and 18.     The results of the First Trimester Nuchal Translucency and Biochemical Screening were within normal range.  The risk for Down syndrome is now estimated to be 1 in 2,033.  The risk for Trisomy 13/18 is less than 1 in 10,000.  Should more definitive information be desired, we would offer amniocentesis.  Because we do not yet know the effectiveness of combined first and second trimester screening, we do not recommend a maternal serum screen to assess the chance for chromosome conditions.  However, if screening for neural tube defects is desired, maternal serum screening for AFP only can be performed between 15 and [redacted] weeks gestation.    Ms. Nicole Craig also elected to have CF and SMA carrier screening at her visit.  Those results should be available within 2 weeks and will be called to the patient.  Cherly Anderson, MS, CGC

## 2018-05-21 LAB — CYSTIC FIBROSIS GENE TEST

## 2018-05-26 LAB — SMN1 COPY NUMBER ANALYSIS (SMA CARRIER SCREENING)

## 2018-06-02 ENCOUNTER — Telehealth: Payer: Self-pay | Admitting: Obstetrics and Gynecology

## 2018-06-02 NOTE — Telephone Encounter (Signed)
  We are pleased to inform Nicole Craig that the results of the recent screening test for Cystic fibrosis (CF) and spinal muscular atrophy (SMA) are now available and are within normal limits.    CF is a genetic condition that occurs most often in Caucasian persons.  It primarily affects the lungs, digestive, and reproductive systems.  For someone to be at risk for having CF, both of their parents must be carriers for CF.  The testing can detect many persons who are carriers for CF and therefore determine if the pregnancy is at an increased risk for this condition.  The blood test results were negative when examined for the 32 most common mutations (or changes) in the gene for CF.  This means that she does not carry any of the most common changes in this gene.  Testing for these 32 mutations detects approximately 69% of carriers who are African American.  Therefore, the chance that she is a carrier based on this negative result has been reduced from 1 in 65 to approximately 1 in 207.  Because this testing cannot detect all changes that may cause CF, we cannot eliminate the chance that this individual is a carrier completely.  The results of the SMA carrier screening are also available.  SMA is also a recessive genetic condition with variable age of onset and severity caused by mutations in the SMN1 gene.  This carrier testing assesses the number of copies of this gene.  Persons with one copy of the SMN1 gene are carriers, and those with no copies are affected with the condition.  Individuals with two or more copies have a reduced chance to be a carrier.  Not all mutations can be detected with this testing, though it can detect 70.5% of carriers in the African American population.  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  Again, this testing cannot eliminate the chance to have a child with SMA, but dramatically  reduces the chance.    We encouraged the patient to call with any questions or concerns as they arise.  We may be reached at [phone].   Nicole Anderson, MS, CGC

## 2018-06-16 ENCOUNTER — Other Ambulatory Visit: Payer: Self-pay

## 2018-06-16 DIAGNOSIS — Z3482 Encounter for supervision of other normal pregnancy, second trimester: Secondary | ICD-10-CM

## 2018-06-19 ENCOUNTER — Ambulatory Visit
Admission: RE | Admit: 2018-06-19 | Discharge: 2018-06-19 | Disposition: A | Discharge status: Home / Self Care | Payer: Medicaid Other | Source: Ambulatory Visit | Attending: Maternal & Fetal Medicine | Admitting: Maternal & Fetal Medicine

## 2018-06-19 DIAGNOSIS — Z3482 Encounter for supervision of other normal pregnancy, second trimester: Secondary | ICD-10-CM | POA: Diagnosis present

## 2018-06-19 NOTE — Progress Notes (Signed)
Ms. Stalbaum returned to [facility] today for her anatomy ultrasound and asked about a message she received from [facility] regarding scheduling a separate appointment due to her hemoglobinopathy test results.  I reviewed those results with Dr. Lady Deutscher today, as follows:  Hemoglobinopathy profile showed normal adult hemoglobin (AA 98.5%), hemoglobin A2 1.5% (normal range is 1.8-3.2%).  MCV 80, hemoglobin 11.9, hematocrit 36.9%.  While low levels of hemoglobin A2 can be associated with iron deficiency or alpha thalassemia trait, the remainder of her labs do not suggest anemia and Dr. Fayrene Fearing feels that no additional testing is needed at this time.  If she were to be a carrier for alpha thalassemia, we would suspect single gene deletion which would not be concerning unless her partner were a carrier of at least two gene deletions present in cis.  We reviewed this information and the patient declined any additional testing on herself or the father of the baby.  Newborn screening we will performed to assess for hemoglobinopathies in the fetus.  Thank you for the referral of this patient.  We may be reached at [phone].  Cherly Anderson, MS, CGC

## 2018-11-10 ENCOUNTER — Encounter: Payer: Self-pay | Admitting: Nurse Practitioner

## 2018-11-10 ENCOUNTER — Other Ambulatory Visit: Payer: Self-pay

## 2018-11-10 ENCOUNTER — Ambulatory Visit: Payer: Medicaid Other | Admitting: Nurse Practitioner

## 2018-11-10 VITALS — BP 103/71 | Temp 98.0°F | Wt 223.6 lb

## 2018-11-10 DIAGNOSIS — Z3483 Encounter for supervision of other normal pregnancy, third trimester: Secondary | ICD-10-CM

## 2018-11-10 NOTE — Progress Notes (Signed)
Denies s/s [phone] for self and female accompanying. Nicole Craig

## 2018-11-10 NOTE — Progress Notes (Signed)
    PRENATAL VISIT NOTE  Subjective:  Nicole Craig is a 33 y.o. [id number] at [redacted]w[redacted]d being seen today for ongoing prenatal care.  She is currently monitored for the following issues for this low-risk pregnancy and has First trimester screening and Polydactyly mixed, hand and foot on their problem list.  Patient reports carpal tunnel symptoms.   .  .   . denies vomiting, abdominal pain, fussiness, diarrhea, cough and difficulty breathing leaking of fluid/ROM.   The following portions of the patient's history were reviewed and updated as appropriate: allergies, current medications, past family history, past medical history, past social history, past surgical history and problem list. Problem list updated.  Objective:   Vitals:   11/10/18 1051  BP: 103/71  Temp: 98 F (36.7 C)  Weight: 223 lb 9.6 oz (101.4 kg)    Fetal Status:           General:  Alert, oriented and cooperative. Patient is in no acute distress.  Skin: Skin is warm and dry. No rash noted.   Cardiovascular: Normal heart rate noted  Respiratory: Normal respiratory effort, no problems with respiration noted  Abdomen: Soft, gravid, appropriate for gestational age.        Pelvic: Cervical exam performed        Extremities: Normal range of motion.     Mental Status: Normal mood and affect. Normal behavior. Normal judgment and thought content.   Assessment and Plan:  Pregnancy: [id number] at [redacted]w[redacted]d  1. Prenatal care, subsequent pregnancy, third trimester Reviewed client record.  Client denies any headaches, however - admits to swelling mainly in left hand - advised client to increase water intake to 8 glasses daily and decrease sodium intake. Advised to elevated LE's and frequently change position with hands to decrease swelling.   10 lbs - total weigh gain during this pregnancy. Client doing well Discussed IOL - cervical check performed and referral completed for [facility]. Discussed with client: states that she has all  items packed and "ready to go" for delivery, however, she states that car seat is not in car as of yet. Discussed methods to "naturally" induce labor.  Denies any additional significant concerns at this time. Client verbalizes understanding and is in agreement with plan of care.  Preterm labor symptoms and general obstetric precautions including but not limited to vaginal bleeding, contractions, leaking of fluid and fetal movement were reviewed in detail with the patient. Please refer to After Visit Summary for other counseling recommendations.  No follow-ups on file.  Future Appointments  Date Time Provider [place]  11/17/2018  3:00 PM [facility] PROVIDER [facility] None    Berniece Andreas, NP

## 2018-11-11 ENCOUNTER — Encounter: Payer: Self-pay | Admitting: Family Medicine

## 2018-11-11 DIAGNOSIS — Z3480 Encounter for supervision of other normal pregnancy, unspecified trimester: Secondary | ICD-10-CM | POA: Insufficient documentation

## 2018-11-13 DIAGNOSIS — F17210 Nicotine dependence, cigarettes, uncomplicated: Secondary | ICD-10-CM

## 2018-11-13 DIAGNOSIS — R825 Elevated urine levels of drugs, medicaments and biological substances: Secondary | ICD-10-CM

## 2018-11-13 DIAGNOSIS — Z1331 Encounter for screening for depression: Secondary | ICD-10-CM

## 2018-11-13 DIAGNOSIS — Z6710 Type A blood, Rh positive: Secondary | ICD-10-CM

## 2018-11-13 DIAGNOSIS — O99012 Anemia complicating pregnancy, second trimester: Secondary | ICD-10-CM

## 2018-11-13 DIAGNOSIS — O99810 Abnormal glucose complicating pregnancy: Secondary | ICD-10-CM

## 2018-11-13 DIAGNOSIS — M1990 Unspecified osteoarthritis, unspecified site: Secondary | ICD-10-CM

## 2018-11-16 ENCOUNTER — Observation Stay
Admission: EM | Admit: 2018-11-16 | Discharge: 2018-11-17 | Disposition: A | Discharge status: Home / Self Care | Payer: Medicaid Other | Attending: Obstetrics & Gynecology | Admitting: Obstetrics & Gynecology

## 2018-11-16 ENCOUNTER — Other Ambulatory Visit: Payer: Self-pay

## 2018-11-16 DIAGNOSIS — O99331 Smoking (tobacco) complicating pregnancy, first trimester: Secondary | ICD-10-CM | POA: Insufficient documentation

## 2018-11-16 DIAGNOSIS — O99011 Anemia complicating pregnancy, first trimester: Principal | ICD-10-CM | POA: Insufficient documentation

## 2018-11-16 DIAGNOSIS — O99321 Drug use complicating pregnancy, first trimester: Secondary | ICD-10-CM | POA: Insufficient documentation

## 2018-11-16 DIAGNOSIS — Z3A13 13 weeks gestation of pregnancy: Secondary | ICD-10-CM | POA: Insufficient documentation

## 2018-11-16 DIAGNOSIS — F1290 Cannabis use, unspecified, uncomplicated: Secondary | ICD-10-CM | POA: Insufficient documentation

## 2018-11-16 NOTE — OB Triage Note (Signed)
Pt presents to L&D with c/o abd pain and pressure for about an hour. Pt reports good fetal movement and pt denies LOF or vaginal bleeding. EF applied and explained. Plan to monitor fetal and maternal well being and assess for labor.

## 2018-11-17 ENCOUNTER — Ambulatory Visit: Payer: Medicaid Other | Admitting: Nurse Practitioner

## 2018-11-17 DIAGNOSIS — Z3480 Encounter for supervision of other normal pregnancy, unspecified trimester: Secondary | ICD-10-CM

## 2018-11-17 DIAGNOSIS — O99321 Drug use complicating pregnancy, first trimester: Secondary | ICD-10-CM | POA: Diagnosis not present

## 2018-11-17 DIAGNOSIS — Z3481 Encounter for supervision of other normal pregnancy, first trimester: Secondary | ICD-10-CM | POA: Diagnosis present

## 2018-11-17 DIAGNOSIS — F1290 Cannabis use, unspecified, uncomplicated: Secondary | ICD-10-CM | POA: Diagnosis not present

## 2018-11-17 DIAGNOSIS — O99011 Anemia complicating pregnancy, first trimester: Secondary | ICD-10-CM | POA: Diagnosis not present

## 2018-11-17 DIAGNOSIS — Z3A13 13 weeks gestation of pregnancy: Secondary | ICD-10-CM | POA: Diagnosis not present

## 2018-11-17 DIAGNOSIS — O99331 Smoking (tobacco) complicating pregnancy, first trimester: Secondary | ICD-10-CM | POA: Diagnosis not present

## 2018-11-17 NOTE — Progress Notes (Signed)
Denies s/s or exposure to Covid-19. Denies travel for self/partner, ED visit 11/16/2018. Taking PNV and Iron QD. Hal Morales, RN

## 2018-11-17 NOTE — Discharge Instructions (Signed)
Call provider or return to birthplace with:  1. Strong regular contractions every 5 minutes. 2. Leaking of fluid from your vagina 3. Vaginal bleeding: Bright red or heavy like a period 4. Decreased Fetal movement  Keep scheduled appointment with [facility]

## 2018-11-17 NOTE — Progress Notes (Signed)
Checked on IOL faxed form Discussed recent ED visit    STI clinic/screening visit  Subjective:  Nicole Craig is a 33 y.o. female being seen today for an STI screening visit. The patient reports they do not have symptoms.  Patient has the following medical conditionshas Polydactyly mixed, hand and foot; Supervision of other normal pregnancy, antepartum; Positive depression screening (10/24/18 - services declined); Abnormal maternal glucose tolerance, antepartum (3 hr GTT equivocal); Anemia complicating pregnancy in second trimester; Cigarette smoker; Arthritis (after left ankle fx 2019); Morbid obesity ([place]); Positive urine drug screen (marijuana); Type A blood, Rh positive; and Labor and delivery indication for care or intervention on their problem list.  Chief Complaint  Patient presents with  . Routine Prenatal Visit    Patient reports  HPI   See flowsheet for further details and programmatic requirements.    The following portions of the patient's history were reviewed and updated as appropriate: allergies, current medications, past family history, past medical history, past social history, past surgical history and problem list. Problem list updated.  Objective:   Vitals:   11/17/18 1502  BP: 106/65  Temp: 98.3 F (36.8 C)  Weight: 223 lb (101.2 kg)    Physical Exam    Assessment and Plan:  [name] is a 33 y.o. female presenting to the [facility] Department for STI screening  1. Supervision of other normal pregnancy, antepartum Doing well  Discussed recent ED visit -  Client doing well at this time. Client denies any additional questions or concerns at this time. OB flow sheet questions reviewed. Client verbalizes understanding and is in agreement with plan of care.      No follow-ups on file.  No future appointments.  Berniece Andreas, NP

## 2018-11-18 ENCOUNTER — Telehealth: Payer: Self-pay

## 2018-11-18 ENCOUNTER — Other Ambulatory Visit: Payer: Self-pay | Admitting: Certified Nurse Midwife

## 2018-11-18 NOTE — Telephone Encounter (Signed)
Phone call to patient to inform of [place] IOL appt for 11/19/2018 @ 0800. Instructed patient to report to [facility] ED ~ 0745 tomorrow morning for IOL appt process. Patient verbalized understanding of same.

## 2018-11-18 NOTE — Progress Notes (Signed)
  Nicole Craig is a 33 y.o. [id number] female dated by LMP c/w [redacted]w[redacted]d ultrasound on 05/15/2018.     Pregnancy Issues: 1. Elevated 1h OGTT with equivocal 3h OGTT 2. Anemia on iron supplement 3. Current smoker, 4-5 cigarettes/day 4. Arthritis left ankel 5. Prepregnancy BMI 36.6 6. Marijuana use during pregnancy, UDS negative x 2  Prenatal care site: [facility]   Prenatal Labs: Blood type/Rh A+  Antibody screen neg  Rubella Immune  Varicella Immune  RPR NR  HBsAg Neg  HIV NR  GC neg  Chlamydia neg  1 hour GTT 103 on 04/22/18, 150 on 08/30/2018  3 hour GTT 87, 156, 125, 151  GBS Negative     Post Partum Planning: - Infant feeding: breast and formula - Contraception: BTL, consent signed 08/08/2018

## 2018-11-19 ENCOUNTER — Inpatient Hospital Stay
Admission: EM | Admit: 2018-11-19 | Discharge: 2018-11-21 | DRG: 806 | Disposition: A | Discharge status: Home / Self Care | Payer: Medicaid Other | Attending: Certified Nurse Midwife | Admitting: Certified Nurse Midwife

## 2018-11-19 ENCOUNTER — Inpatient Hospital Stay: Payer: Medicaid Other | Admitting: Registered Nurse

## 2018-11-19 ENCOUNTER — Other Ambulatory Visit: Payer: Self-pay

## 2018-11-19 ENCOUNTER — Encounter: Payer: Self-pay | Admitting: Certified Nurse Midwife

## 2018-11-19 DIAGNOSIS — D649 Anemia, unspecified: Secondary | ICD-10-CM | POA: Diagnosis present

## 2018-11-19 DIAGNOSIS — O99334 Smoking (tobacco) complicating childbirth: Secondary | ICD-10-CM | POA: Diagnosis present

## 2018-11-19 DIAGNOSIS — Z3480 Encounter for supervision of other normal pregnancy, unspecified trimester: Secondary | ICD-10-CM | POA: Diagnosis not present

## 2018-11-19 DIAGNOSIS — Z1159 Encounter for screening for other viral diseases: Secondary | ICD-10-CM

## 2018-11-19 DIAGNOSIS — O9902 Anemia complicating childbirth: Secondary | ICD-10-CM | POA: Diagnosis present

## 2018-11-19 DIAGNOSIS — F17210 Nicotine dependence, cigarettes, uncomplicated: Secondary | ICD-10-CM | POA: Diagnosis present

## 2018-11-19 DIAGNOSIS — Z3A40 40 weeks gestation of pregnancy: Secondary | ICD-10-CM | POA: Diagnosis not present

## 2018-11-19 DIAGNOSIS — O26893 Other specified pregnancy related conditions, third trimester: Secondary | ICD-10-CM | POA: Diagnosis present

## 2018-11-19 DIAGNOSIS — F1290 Cannabis use, unspecified, uncomplicated: Secondary | ICD-10-CM | POA: Diagnosis present

## 2018-11-19 DIAGNOSIS — O99324 Drug use complicating childbirth: Secondary | ICD-10-CM | POA: Diagnosis present

## 2018-11-19 LAB — CBC
HCT: 36.5 % (ref 36.0–46.0)
Hemoglobin: 11.8 g/dL — ABNORMAL LOW (ref 12.0–15.0)
MCH: 26.3 pg (ref 26.0–34.0)
MCHC: 32.3 g/dL (ref 30.0–36.0)
MCV: 81.3 fL (ref 80.0–100.0)
Platelets: 176 10*3/uL (ref 150–400)
RBC: 4.49 MIL/uL (ref 3.87–5.11)
RDW: 15.4 % (ref 11.5–15.5)
WBC: 9.7 10*3/uL (ref 4.0–10.5)
nRBC: 0 % (ref 0.0–0.2)

## 2018-11-19 LAB — URINE DRUG SCREEN, QUALITATIVE (ARMC ONLY)
Amphetamines, Ur Screen: NOT DETECTED
Barbiturates, Ur Screen: NOT DETECTED
Benzodiazepine, Ur Scrn: NOT DETECTED
Cannabinoid 50 Ng, Ur ~~LOC~~: NOT DETECTED
Cocaine Metabolite,Ur ~~LOC~~: NOT DETECTED
MDMA (Ecstasy)Ur Screen: NOT DETECTED
Methadone Scn, Ur: NOT DETECTED
Opiate, Ur Screen: NOT DETECTED
Phencyclidine (PCP) Ur S: NOT DETECTED
Tricyclic, Ur Screen: NOT DETECTED

## 2018-11-19 LAB — ABO/RH: ABO/RH(D): A POS

## 2018-11-19 LAB — SARS CORONAVIRUS 2 BY RT PCR (HOSPITAL ORDER, PERFORMED IN ~~LOC~~ HOSPITAL LAB): SARS Coronavirus 2: NEGATIVE

## 2018-11-19 MED ORDER — FENTANYL 2.5 MCG/ML W/ROPIVACAINE 0.15% IN NS 100 ML EPIDURAL (ARMC)
12.0000 mL/h | EPIDURAL | Status: DC
  Administered 2018-11-19 – 2018-11-20 (×3): 12 mL/h via EPIDURAL
  Filled 2018-11-19 (×2): qty 100

## 2018-11-19 MED ORDER — OXYTOCIN BOLUS FROM INFUSION
500.0000 mL | Freq: Once | INTRAVENOUS | Status: AC
  Administered 2018-11-20: 500 mL via INTRAVENOUS

## 2018-11-19 MED ORDER — FENTANYL 2.5 MCG/ML W/ROPIVACAINE 0.15% IN NS 100 ML EPIDURAL (ARMC)
EPIDURAL | Status: AC
  Filled 2018-11-19: qty 100

## 2018-11-19 MED ORDER — EPHEDRINE 5 MG/ML INJ: 10.0000 mg | INTRAVENOUS | Status: DC | PRN

## 2018-11-19 MED ORDER — OXYTOCIN 40 UNITS IN NORMAL SALINE INFUSION - SIMPLE MED: 2.5000 [IU]/h | INTRAVENOUS | Status: DC

## 2018-11-19 MED ORDER — ONDANSETRON HCL 4 MG/2ML IJ SOLN: 4.0000 mg | Freq: Four times a day (QID) | INTRAMUSCULAR | Status: DC | PRN

## 2018-11-19 MED ORDER — TERBUTALINE SULFATE 1 MG/ML IJ SOLN: 0.2500 mg | Freq: Once | INTRAMUSCULAR | Status: DC | PRN

## 2018-11-19 MED ORDER — BUTORPHANOL TARTRATE 2 MG/ML IJ SOLN: 1.0000 mg | INTRAMUSCULAR | Status: DC | PRN

## 2018-11-19 MED ORDER — LIDOCAINE-EPINEPHRINE (PF) 1.5 %-1:200000 IJ SOLN
INTRAMUSCULAR | Status: DC | PRN
  Administered 2018-11-19: 3 mL via EPIDURAL

## 2018-11-19 MED ORDER — PHENYLEPHRINE 40 MCG/ML (10ML) SYRINGE FOR IV PUSH (FOR BLOOD PRESSURE SUPPORT): 80.0000 ug | PREFILLED_SYRINGE | INTRAVENOUS | Status: DC | PRN

## 2018-11-19 MED ORDER — LACTATED RINGERS IV SOLN
INTRAVENOUS | Status: DC
  Administered 2018-11-19 – 2018-11-20 (×4): via INTRAVENOUS

## 2018-11-19 MED ORDER — MISOPROSTOL 25 MCG QUARTER TABLET
25.0000 ug | ORAL_TABLET | ORAL | Status: DC | PRN
  Administered 2018-11-19: 25 ug via BUCCAL
  Filled 2018-11-19: qty 1

## 2018-11-19 MED ORDER — LIDOCAINE HCL (PF) 1 % IJ SOLN
30.0000 mL | INTRAMUSCULAR | Status: AC | PRN
  Administered 2018-11-19: 14:00:00 3 mL via SUBCUTANEOUS

## 2018-11-19 MED ORDER — LACTATED RINGERS IV SOLN: 500.0000 mL | INTRAVENOUS | Status: DC | PRN

## 2018-11-19 MED ORDER — LACTATED RINGERS IV SOLN: 500.0000 mL | Freq: Once | INTRAVENOUS | Status: DC

## 2018-11-19 MED ORDER — PHENYLEPHRINE HCL (PRESSORS) 10 MG/ML IV SOLN
INTRAVENOUS | Status: DC | PRN
  Administered 2018-11-19: 120 ug via INTRAVENOUS

## 2018-11-19 MED ORDER — OXYTOCIN 40 UNITS IN NORMAL SALINE INFUSION - SIMPLE MED
1.0000 m[IU]/min | INTRAVENOUS | Status: DC
  Administered 2018-11-19: 2 m[IU]/min via INTRAVENOUS
  Administered 2018-11-20 (×2): 3 m[IU]/min via INTRAVENOUS
  Filled 2018-11-19: qty 1000

## 2018-11-19 MED ORDER — ACETAMINOPHEN 325 MG PO TABS
650.0000 mg | ORAL_TABLET | ORAL | Status: DC | PRN
  Administered 2018-11-19: 20:00:00 650 mg via ORAL
  Filled 2018-11-19: qty 2

## 2018-11-19 MED ORDER — SOD CITRATE-CITRIC ACID 500-334 MG/5ML PO SOLN: 30.0000 mL | ORAL | Status: DC | PRN

## 2018-11-19 MED ORDER — MISOPROSTOL 25 MCG QUARTER TABLET: 25.0000 ug | ORAL_TABLET | ORAL | Status: DC | PRN

## 2018-11-19 MED ORDER — BUPIVACAINE HCL (PF) 0.25 % IJ SOLN
INTRAMUSCULAR | Status: DC | PRN
  Administered 2018-11-19 (×2): 3 mL via EPIDURAL

## 2018-11-19 MED ORDER — DIPHENHYDRAMINE HCL 50 MG/ML IJ SOLN: 12.5000 mg | INTRAMUSCULAR | Status: DC | PRN

## 2018-11-19 MED ORDER — LACTATED RINGERS IV SOLN
INTRAVENOUS | Status: DC
  Administered 2018-11-19: 22:00:00 via INTRAUTERINE

## 2018-11-19 NOTE — Progress Notes (Signed)
Labor Progress Note  Nicole Craig is a 33 y.o. [id number] at [redacted]w[redacted]d by LMP admitted for elective IOL at term.   Subjective:  Comfortable with epidural in place. Mother at the bedside for support.   Objective: BP (!) 108/59   Pulse 99   Temp 98.5 F (36.9 C) (Oral)   Resp 18   Ht 5\' 3"  (1.6 m)   Wt 101.2 kg   LMP 02/12/2018 (Exact Date)   SpO2 99%   BMI 39.50 kg/m   Fetal Assessment: FHT:  FHR: 125 bpm, variability: moderate,  accelerations:  Present,  decelerations:  Absent Category/reactivity:  Category I UC:   regular, every 2-5 minutes, toco not tracing well when patient on her side SVE: per RN Mindy Edge at 1507 Dilation: 3cm  Effacement: 50%  Station:  -3  Consistency: soft  Position: anterior  Membrane status: intact Amniotic color: n/a  Labs: Lab Results  Component Value Date   WBC 9.7 11/19/2018   HGB 11.8 (L) 11/19/2018   HCT 36.5 11/19/2018   MCV 81.3 11/19/2018   PLT 176 11/19/2018    Assessment / Plan: Induction of labor  Labor: Pitocin infusing at 42mu/min, continue to titrate per protocol to adequate contraction pattern Fetal Wellbeing:  Category I Pain Control:  Epidural Anticipated MOD:  NSVD  Lisette Grinder, CNM 11/19/2018, 5:04 PM

## 2018-11-19 NOTE — Progress Notes (Signed)
Labor Progress Note  Nicole Craig is a 33 y.o. [id number] at [redacted]w[redacted]d by LMP admitted for elective IOL at term.   Subjective:  Starting to feel contractions more. In right side-lying position with peanut ball in place. Just received epidural bolus from anesthesia.   Objective: BP 111/72 (BP Location: Right Arm)   Pulse (!) 112   Temp 98.8 F (37.1 C) (Oral)   Resp 20   Ht 5\' 3"  (1.6 m)   Wt 101.2 kg   LMP 02/12/2018 (Exact Date)   SpO2 99%   BMI 39.50 kg/m   Fetal Assessment: FHT:  FHR: 130 bpm, variability: moderate,  accelerations:  Present,  decelerations:  Present variable Category/reactivity:  Category II UC:   regular, every 2-3 minutes SVE: per RN Oley Balm Corse Dilation: 6cm  Effacement: 70%  Station:  -1  Consistency: soft  Position: anterior  Membrane status: AROM 1832 Amniotic color: clear  Labs: Lab Results  Component Value Date   WBC 9.7 11/19/2018   HGB 11.8 (L) 11/19/2018   HCT 36.5 11/19/2018   MCV 81.3 11/19/2018   PLT 176 11/19/2018    Assessment / Plan: Induction of labor  Labor: Pitocin infusing at 4mu/min. IUPC in place. MVUs at least 200.  Fetal Wellbeing:  Category II for recurrent variable decels, but with good variability and accels. Overall reassuring. Amnioinfusion started with bolus of 246mL of LR with 122mL/hr continuous.  Pain Control:  Epidural Anticipated MOD:  NSVD  Lisette Grinder, CNM 11/19/2018, 10:18 PM

## 2018-11-19 NOTE — Anesthesia Preprocedure Evaluation (Signed)
Anesthesia Evaluation   Patient awake    Reviewed: Allergy & Precautions, H&P , NPO status , Patient's Chart, lab work & pertinent test results  Airway Mallampati: II  TM Distance: >3 FB Neck ROM: full    Dental  (+) Teeth Intact   Pulmonary Current Smoker,    Pulmonary exam normal        Cardiovascular negative cardio ROS Normal cardiovascular exam     Neuro/Psych  Headaches, negative psych ROS   GI/Hepatic Neg liver ROS, GERD  Medicated and Controlled,  Endo/Other  negative endocrine ROS  Renal/GU negative Renal ROS     Musculoskeletal   Abdominal   Peds  Hematology  (+) Blood dyscrasia, anemia ,   Anesthesia Other Findings   Reproductive/Obstetrics (+) Pregnancy                             Anesthesia Physical Anesthesia Plan  ASA: II  Anesthesia Plan: Epidural   Post-op Pain Management:    Induction:   PONV Risk Score and Plan:   Airway Management Planned:   Additional Equipment:   Intra-op Plan:   Post-operative Plan:   Informed Consent: I have reviewed the patients History and Physical, chart, labs and discussed the procedure including the risks, benefits and alternatives for the proposed anesthesia with the patient or authorized representative who has indicated his/her understanding and acceptance.     Dental Advisory Given  Plan Discussed with: Anesthesiologist and CRNA  Anesthesia Plan Comments:         Anesthesia Quick Evaluation

## 2018-11-19 NOTE — Anesthesia Procedure Notes (Signed)
Epidural Patient location during procedure: OB Start time: 11/19/2018 2:06 PM End time: 11/19/2018 2:40 PM  Staffing Anesthesiologist: Piscitello, Precious Haws, MD Resident/CRNA: Caryl Asp, CRNA Performed: resident/CRNA   Preanesthetic Checklist Completed: patient identified, site marked, surgical consent, pre-op evaluation, timeout performed, IV checked, risks and benefits discussed and monitors and equipment checked  Epidural Patient position: sitting Prep: Betadine Patient monitoring: heart rate, continuous pulse ox and blood pressure Approach: midline Location: L4-L5 Injection technique: LOR air  Needle:  Needle type: Tuohy  Needle gauge: 18 G Needle length: 9 cm and 9 Needle insertion depth: 6 cm Catheter type: closed end flexible Catheter size: 20 Guage Catheter at skin depth: 11 cm Test dose: negative and 1.5% lidocaine with Epi 1:200 K  Assessment Events: blood not aspirated, injection not painful, no injection resistance, negative IV test and no paresthesia  Additional Notes   Patient tolerated the insertion well without complications.Reason for block:procedure for pain
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
## 2018-11-19 NOTE — Progress Notes (Addendum)
Nicole Craig is a 33 y.o. [id number] at [redacted]w[redacted]d for iol elective at term. S/p epidural with moderate pain relief, having repetitive lates. Amnioinfusion running at 179ml/hr.   Subjective: Coping well  Objective: BP 111/72 (BP Location: Right Arm)   Pulse (!) 112   Temp 98.8 F (37.1 C) (Oral)   Resp 20   Ht 5\' 3"  (1.6 m)   Wt 101.2 kg   LMP 02/12/2018 (Exact Date)   SpO2 98%   BMI 39.50 kg/m  No intake/output data recorded. Total I/O In: -  Out: 775 [Urine:775]  FHT:  FHR: 130 bpm, variability: moderate,  accelerations:  Present,  decelerations:  Present variable decels with each contraction UC:   regular, every 2-3 minutes SVE:   Dilation: 7 Effacement (%): 60 Station: -1, -2 Exam by:: Leafy Ro MD  Amniotic fluid still clear, AROM at [name]: Lab Results  Component Value Date   WBC 9.7 11/19/2018   HGB 11.8 (L) 11/19/2018   HCT 36.5 11/19/2018   MCV 81.3 11/19/2018   PLT 176 11/19/2018    Assessment / Plan: Induction of labor due to term with favorable cervix,  progressing well on pitocin Cat II tracing. Will continue with amnioinfusion and repositioning. PItocin reduced in half to 60mu/min. Currently mod variability with positive fetal scalp stim. Will keep continuous monitoring. Expedited delivery as clinically appropriate.  Labor: Progressing normally Preeclampsia: n/a Fetal Wellbeing:  Category II Pain Control:  Epidural and anesthesia present in the room I/D:  n/a Anticipated MOD:  NSVD  Benjaman Kindler 11/19/2018, 11:20 PM

## 2018-11-19 NOTE — Progress Notes (Signed)
Labor Progress Note  Nicole Craig is a 33 y.o. [id number] at [redacted]w[redacted]d by LMP admitted for elective IOL at term.   Subjective:  Comfortable with epidural in place. Mother at the bedside for support.   Objective: BP (!) 108/59   Pulse 99   Temp 98.5 F (36.9 C) (Oral)   Resp 18   Ht 5\' 3"  (1.6 m)   Wt 101.2 kg   LMP 02/12/2018 (Exact Date)   SpO2 99%   BMI 39.50 kg/m   Fetal Assessment: FHT:  FHR: 125 bpm, variability: moderate,  accelerations:  Present,  decelerations:  Absent Category/reactivity:  Category I UC:   regular, every 2-5 minutes, toco not tracing well when patient on her side SVE:  Dilation: 4cm  Effacement: 50%  Station:  -1  Consistency: soft  Position: anterior  Membrane status: AROM 1832 Amniotic color: clear  Labs: Lab Results  Component Value Date   WBC 9.7 11/19/2018   HGB 11.8 (L) 11/19/2018   HCT 36.5 11/19/2018   MCV 81.3 11/19/2018   PLT 176 11/19/2018    Assessment / Plan: Induction of labor  Labor: Pitocin infusing at 42mu/min. IUPC placed. Continue to titrate per protocol to adequate contraction pattern.  Fetal Wellbeing:  Category I Pain Control:  Epidural Anticipated MOD:  NSVD  Lisette Grinder, CNM 11/19/2018, 6:32 PM

## 2018-11-20 ENCOUNTER — Inpatient Hospital Stay: Payer: Medicaid Other | Admitting: Anesthesiology

## 2018-11-20 DIAGNOSIS — Z3480 Encounter for supervision of other normal pregnancy, unspecified trimester: Secondary | ICD-10-CM

## 2018-11-20 LAB — BPAM RBC
Blood Product Expiration Date: 202008132359
Blood Product Expiration Date: 202008132359
Unit Type and Rh: 6200
Unit Type and Rh: 6200

## 2018-11-20 LAB — BASIC METABOLIC PANEL
Anion gap: 5 (ref 5–15)
BUN: 5 mg/dL — ABNORMAL LOW (ref 6–20)
CO2: 21 mmol/L — ABNORMAL LOW (ref 22–32)
Calcium: 8.2 mg/dL — ABNORMAL LOW (ref 8.9–10.3)
Chloride: 108 mmol/L (ref 98–111)
Creatinine, Ser: 0.46 mg/dL (ref 0.44–1.00)
GFR calc Af Amer: >60 mL/min (ref >60)
GFR calc non Af Amer: >60 mL/min (ref >60)
Glucose, Bld: 126 mg/dL — ABNORMAL HIGH (ref 70–99)
Potassium: 3.5 mmol/L (ref 3.5–5.1)
Sodium: 134 mmol/L — ABNORMAL LOW (ref 135–145)

## 2018-11-20 LAB — TYPE AND SCREEN
ABO/RH(D): A POS
Antibody Screen: POSITIVE
Unit division: 0
Unit division: 0

## 2018-11-20 LAB — CBC
HCT: 30.8 % — ABNORMAL LOW (ref 36.0–46.0)
Hemoglobin: 10 g/dL — ABNORMAL LOW (ref 12.0–15.0)
MCH: 26.2 pg (ref 26.0–34.0)
MCHC: 32.5 g/dL (ref 30.0–36.0)
MCV: 80.8 fL (ref 80.0–100.0)
Platelets: 137 10*3/uL — ABNORMAL LOW (ref 150–400)
RBC: 3.81 MIL/uL — ABNORMAL LOW (ref 3.87–5.11)
RDW: 15.4 % (ref 11.5–15.5)
WBC: 11.8 10*3/uL — ABNORMAL HIGH (ref 4.0–10.5)
nRBC: 0 % (ref 0.0–0.2)

## 2018-11-20 LAB — RPR: RPR Ser Ql: NONREACTIVE

## 2018-11-20 MED ORDER — SENNOSIDES-DOCUSATE SODIUM 8.6-50 MG PO TABS
2.0000 | ORAL_TABLET | ORAL | Status: DC
  Administered 2018-11-21: 2 via ORAL
  Filled 2018-11-20: qty 2

## 2018-11-20 MED ORDER — WITCH HAZEL-GLYCERIN EX PADS: 1.0000 "application " | MEDICATED_PAD | CUTANEOUS | Status: DC | PRN

## 2018-11-20 MED ORDER — BENZOCAINE-MENTHOL 20-0.5 % EX AERO: 1.0000 "application " | INHALATION_SPRAY | CUTANEOUS | Status: DC | PRN

## 2018-11-20 MED ORDER — PRENATAL MULTIVITAMIN CH
1.0000 | ORAL_TABLET | Freq: Every day | ORAL | Status: DC
  Administered 2018-11-20 – 2018-11-21 (×2): 1 via ORAL
  Filled 2018-11-20 (×2): qty 1

## 2018-11-20 MED ORDER — IBUPROFEN 600 MG PO TABS
ORAL_TABLET | ORAL | Status: AC
  Filled 2018-11-20: qty 1

## 2018-11-20 MED ORDER — DIBUCAINE (PERIANAL) 1 % EX OINT: 1.0000 "application " | TOPICAL_OINTMENT | CUTANEOUS | Status: DC | PRN

## 2018-11-20 MED ORDER — IBUPROFEN 600 MG PO TABS
600.0000 mg | ORAL_TABLET | Freq: Four times a day (QID) | ORAL | Status: AC
  Administered 2018-11-20 (×3): 600 mg via ORAL
  Filled 2018-11-20 (×2): qty 1

## 2018-11-20 MED ORDER — SIMETHICONE 80 MG PO CHEW: 160.0000 mg | CHEWABLE_TABLET | ORAL | Status: DC | PRN

## 2018-11-20 MED ORDER — ACETAMINOPHEN 500 MG PO TABS
1000.0000 mg | ORAL_TABLET | ORAL | Status: AC
  Administered 2018-11-21: 1000 mg via ORAL
  Filled 2018-11-20: qty 2

## 2018-11-20 MED ORDER — COCONUT OIL OIL: 1.0000 "application " | TOPICAL_OIL | Status: DC | PRN

## 2018-11-20 MED ORDER — GABAPENTIN 300 MG PO CAPS
600.0000 mg | ORAL_CAPSULE | ORAL | Status: AC
  Administered 2018-11-21: 600 mg via ORAL
  Filled 2018-11-20 (×2): qty 2

## 2018-11-20 MED ORDER — ONDANSETRON HCL 4 MG PO TABS: 4.0000 mg | ORAL_TABLET | ORAL | Status: DC | PRN

## 2018-11-20 MED ORDER — ONDANSETRON HCL 4 MG/2ML IJ SOLN: 4.0000 mg | INTRAMUSCULAR | Status: DC | PRN

## 2018-11-20 MED ORDER — ACETAMINOPHEN 325 MG PO TABS
650.0000 mg | ORAL_TABLET | ORAL | Status: DC | PRN
  Administered 2018-11-20 – 2018-11-21 (×4): 650 mg via ORAL
  Filled 2018-11-20 (×4): qty 2

## 2018-11-20 MED ORDER — DIPHENHYDRAMINE HCL 25 MG PO CAPS: 25.0000 mg | ORAL_CAPSULE | Freq: Four times a day (QID) | ORAL | Status: DC | PRN

## 2018-11-20 NOTE — Discharge Instructions (Signed)

## 2018-11-20 NOTE — Progress Notes (Signed)
Labor Progress Note  Nicole Craig is a 33 y.o. H2Z2248 at [redacted]w[redacted]d by LMP admitted for elective IOL at term.   Subjective:  Feeling contractions, but coping well.   Objective: BP 130/86   Pulse (!) 103   Temp 97.6 F (36.4 C) (Oral)   Resp 18   Ht 5\' 3"  (1.6 m)   Wt 101.2 kg   LMP 02/12/2018 (Exact Date)   SpO2 99%   BMI 39.50 kg/m   Fetal Assessment: FHT:  FHR: 120 bpm, variability: moderate,  accelerations:  Present,  decelerations:  Present variable Category/reactivity:  Category II UC:   regular, every 2-3 minutes SVE:  Dilation: 8-9cm  Effacement: 80%  Station:  -1  Consistency: soft  Position: anterior  Membrane status: AROM 1832 11/19/2018 Amniotic color: clear  Labs: Lab Results  Component Value Date   WBC 9.7 11/19/2018   HGB 11.8 (L) 11/19/2018   HCT 36.5 11/19/2018   MCV 81.3 11/19/2018   PLT 176 11/19/2018    Assessment / Plan: Induction of labor  Labor: Pitocin infusing at 69mu/min, stopped. IUPC in place. Continued to contract regularly after initial decrease of pitocin, though decreased in intensity. Remain cervix anterior and on patient's right. Put in right side lying release with peanut ball in place.  Fetal Wellbeing:  Category II for recurrent variable decels, but with good variability and accels. Amnioinfusion infusing at 176mL/hr.  Pain Control:  Epidural Anticipated MOD:  NSVD  Lisette Grinder, CNM 11/20/2018, 1:02 AM

## 2018-11-20 NOTE — Discharge Summary (Signed)
Obstetric Discharge Summary   Patient Name: Nicole Craig DOB: [birth year] MRN: [id number]  Date of Admission: 11/19/2018 Date of Delivery: 11/20/2018 Delivered by: Christeen DouglasBethany Beasley, MD Date of Discharge: 11/21/2018  Primary OB: [facility]  FAO:[id number]:Patient's last menstrual period was 02/12/2018 (exact date). EDC Estimated Date of Delivery: 11/19/18 Gestational Age at Delivery: 8653w1d   Antepartum complications:  1.Elevated 1h OGTT with equivocal 3h OGTT 2. Anemia on iron supplement 3. Current smoker, 4-5 cigarettes/day 4. Arthritis left ankle 5. Prepregnancy BMI 36.6 6. Marijuana use during pregnancy, UDS negative x 2  Admitting Diagnosis: planned elective induction  Secondary Diagnoses: Patient Active Problem List   Diagnosis Date Noted  . Labor and delivery indication for care or intervention 11/17/2018  . Positive depression screening (10/24/18 - services declined) 11/13/2018  . Abnormal maternal glucose tolerance, antepartum (3 hr GTT equivocal) 11/13/2018  . Anemia complicating pregnancy in second trimester 11/13/2018  . Cigarette smoker 11/13/2018  . 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 Morbid obesity (HCC) 11/13/2018  . Positive urine drug screen (marijuana) 11/13/2018  . Type A blood, Rh positive 11/13/2018  . Supervision of other normal pregnancy, antepartum 11/11/2018  . Polydactyly mixed, hand and foot     Induction/Augmentation: AROM, Pitocin and Cytotec Complications: None Intrapartum complications/course: elective IOL at term, intermittent cat II tracing, SVD with nuchal cord x 1.  Delivery Type: spontaneous vaginal delivery Anesthesia: epidural Placenta: spontaneous Laceration: none Episiotomy: none  Newborn Data: Live born female "Devin" Birth Weight: 6#15 APGAR: 409, 9  Newborn Delivery   Birth date/time: 11/20/2018 03:05:00 Delivery type: Vaginal, Spontaneous       Postpartum Course  Patient had an uncomplicated postpartum course.  By time  of discharge on PPD#1, her pain was controlled on oral pain medications; she had appropriate lochia and was ambulating, voiding without difficulty and tolerating regular diet.  She was deemed stable for discharge to home.       Labs: CBC Latest Ref Rng & Units 11/21/2018 11/20/2018 11/19/2018  WBC 4.0 - 10.5 K/uL 10.8(H) 11.8(H) 9.7  Hemoglobin 12.0 - 15.0 g/dL 10.4(L) 10.0(L) 11.8(L)  Hematocrit 36.0 - 46.0 % 31.7(L) 30.8(L) 36.5  Platelets 150 - 400 K/uL 165 137(L) 176   A POS Performed at [facility], [address] [place], [place] [address]   Physical exam:  BP 120/78 (BP Location: Right Arm)   Pulse 93   Temp 98 F (36.7 C) (Oral)   Resp 20   Ht 5\' 3"  (1.6 m)   Wt 101.2 kg   LMP 02/12/2018 (Exact Date)   SpO2 100%   Breastfeeding Unknown   BMI 39.50 kg/m  General: alert and no distress Pulm: normal respiratory effort Lochia: appropriate Abdomen: soft, NT Uterine Fundus: firm, below umbilicus Extremities: No evidence of DVT seen on physical exam. No lower extremity edema.   Disposition: stable, discharge to home Baby Feeding: formula Baby Disposition: home with mom  Contraception: postpartum IUD planned  Prenatal Labs:  Blood type/Rh A+  Antibody screen neg  Rubella Immune  Varicella Immune  RPR NR  HBsAg Neg  HIV NR  GC neg  Chlamydia neg  1 hour GTT 103 on 04/22/18, 150 on 08/30/2018  3 hour GTT 87, 156, 125, 151  GBS Negative    Rh Immune globulin given: n/a Rubella vaccine given: n/a Tdap vaccine given in AP or PP setting: AP 08/29/2018 Flu vaccine given in AP or PP setting: declined AP  Plan:  Nicole Craig was discharged to home  in good condition. Follow-up appointment at [facility] with delivery provider in 6 weeks  Discharge Instructions: Per After Visit Summary. Activity: Advance as tolerated. Pelvic rest for 6 weeks.   Diet: Regular Discharge Medications: Allergies as of 11/21/2018   No Known Allergies      Medication List    TAKE these medications   acetaminophen 325 MG tablet Commonly known as: Tylenol Take 2 tablets (650 mg total) by mouth every 4 (four) hours as needed (for pain scale < 4).   ferrous sulfate 325 (65 FE) MG tablet Take 325 mg by mouth daily with breakfast.   ibuprofen 600 MG tablet Commonly known as: ADVIL Take 1 tablet (600 mg total) by mouth every 6 (six) hours as needed.   prenatal multivitamin Tabs tablet Take 1 tablet by mouth daily at 12 noon.   senna-docusate 8.6-50 MG tablet Commonly known as: Senokot-S Take 2 tablets by mouth daily.      Outpatient follow up:  Follow-up Information    Benjaman Kindler, MD. Schedule an appointment as soon as possible for a visit in 6 week(s).   Specialty: Obstetrics and Gynecology Why: For routine postpartum visit.  Contact information: [place] [address] [phone]            Signed: Francetta Found, CNM 11/21/2018 10:00 AM

## 2018-11-20 NOTE — Progress Notes (Signed)
Post Partum Day 0 Subjective: Doing well, no complaints.  Tolerating regular diet, pain with PO meds, voiding and ambulating without difficulty.  No CP SOB F/C N/V or leg pain no HA change of vision, RUQ/epigastric pain  Objective: BP 107/64 (BP Location: Right Arm)   Pulse 91   Temp 98.3 F (36.8 C) (Oral)   Resp 18   Ht 5\' 3"  (1.6 m)   Wt 101.2 kg   LMP 02/12/2018 (Exact Date)   SpO2 99% Comment: Room Air  Breastfeeding Unknown   BMI 39.50 kg/m    Physical Exam:  General: NAD CV: RRR Pulm: nl effort, CTABL Lochia: moderate Uterine Fundus: fundus firm and below umbilicus DVT Evaluation: no cords, ttp LEs   Recent Labs    11/19/18 0826  HGB 11.8*  HCT 36.5  WBC 9.7  PLT 176    Assessment/Plan: 33 y.o. [id number] postpartum day # 0  1.  Patient doing well, beautiful son [place]. 2. Desires tubal, and agrees to have done while in-house. Prefers tomorrow AM.  Can schedule this and will make NPO after midnight.   Will need to sign consents. 3. Continue routine post partum cares  ----- Larey Days, MD, [place] Attending Obstetrician and Gynecologist [facility], Department of [facility]

## 2018-11-21 ENCOUNTER — Encounter
Admission: EM | Disposition: A | Discharge status: Home / Self Care | Payer: Self-pay | Source: Home / Self Care | Attending: Certified Nurse Midwife

## 2018-11-21 LAB — CBC
HCT: 31.7 % — ABNORMAL LOW (ref 36.0–46.0)
Hemoglobin: 10.4 g/dL — ABNORMAL LOW (ref 12.0–15.0)
MCH: 26.6 pg (ref 26.0–34.0)
MCHC: 32.8 g/dL (ref 30.0–36.0)
MCV: 81.1 fL (ref 80.0–100.0)
Platelets: 165 10*3/uL (ref 150–400)
RBC: 3.91 MIL/uL (ref 3.87–5.11)
RDW: 15.6 % — ABNORMAL HIGH (ref 11.5–15.5)
WBC: 10.8 10*3/uL — ABNORMAL HIGH (ref 4.0–10.5)
nRBC: 0 % (ref 0.0–0.2)

## 2018-11-21 SURGERY — LIGATION, FALLOPIAN TUBE, POSTPARTUM
Anesthesia: General | Laterality: Bilateral

## 2018-11-21 MED ORDER — SENNOSIDES-DOCUSATE SODIUM 8.6-50 MG PO TABS: 2.0000 | ORAL_TABLET | ORAL | Status: DC

## 2018-11-21 MED ORDER — FENTANYL CITRATE (PF) 250 MCG/5ML IJ SOLN
INTRAMUSCULAR | Status: AC
  Filled 2018-11-21: qty 5

## 2018-11-21 MED ORDER — PROPOFOL 10 MG/ML IV BOLUS
INTRAVENOUS | Status: AC
  Filled 2018-11-21: qty 20

## 2018-11-21 MED ORDER — ACETAMINOPHEN 325 MG PO TABS: 650.0000 mg | ORAL_TABLET | ORAL | Status: DC | PRN

## 2018-11-21 MED ORDER — FENTANYL CITRATE (PF) 100 MCG/2ML IJ SOLN
INTRAMUSCULAR | Status: AC
  Filled 2018-11-21: qty 2

## 2018-11-21 MED ORDER — PROPOFOL 10 MG/ML IV BOLUS
INTRAVENOUS | Status: AC
  Filled 2018-11-21: qty 40

## 2018-11-21 MED ORDER — MIDAZOLAM HCL 2 MG/2ML IJ SOLN
INTRAMUSCULAR | Status: AC
  Filled 2018-11-21: qty 2

## 2018-11-21 MED ORDER — IBUPROFEN 600 MG PO TABS: 600.0000 mg | ORAL_TABLET | Freq: Four times a day (QID) | ORAL | 0 refills | Status: AC | PRN

## 2018-11-21 SURGICAL SUPPLY — 35 items
BLADE SURG 15 STRL LF DISP TIS (BLADE) IMPLANT
BLADE SURG 15 STRL SS (BLADE)
BLADE SURG SZ11 CARB STEEL (BLADE) IMPLANT
CHLORAPREP W/TINT 26 (MISCELLANEOUS) IMPLANT
COVER WAND RF STERILE (DRAPES) IMPLANT
DERMABOND ADVANCED (GAUZE/BANDAGES/DRESSINGS)
DERMABOND ADVANCED .7 DNX12 (GAUZE/BANDAGES/DRESSINGS) IMPLANT
DRAPE LAPAROTOMY 100X77 ABD (DRAPES) IMPLANT
DRSG TEGADERM 2-3/8X2-3/4 SM (GAUZE/BANDAGES/DRESSINGS) IMPLANT
DRSG TELFA 4X3 1S NADH ST (GAUZE/BANDAGES/DRESSINGS) IMPLANT
ELECT CAUTERY BLADE 6.4 (BLADE) IMPLANT
ELECT REM PT RETURN 9FT ADLT (ELECTROSURGICAL)
ELECTRODE REM PT RTRN 9FT ADLT (ELECTROSURGICAL) IMPLANT
GLOVE PI ORTHOPRO 6.5 (GLOVE)
GLOVE PI ORTHOPRO STRL 6.5 (GLOVE) IMPLANT
GLOVE SURG SYN 6.5 ES PF (GLOVE) IMPLANT
GOWN STRL REUS W/ TWL LRG LVL3 (GOWN DISPOSABLE) IMPLANT
GOWN STRL REUS W/TWL LRG LVL3 (GOWN DISPOSABLE)
KIT TURNOVER CYSTO (KITS) IMPLANT
LABEL OR SOLS (LABEL) IMPLANT
LIGASURE VESSEL 5MM BLUNT TIP (ELECTROSURGICAL) IMPLANT
NEEDLE HYPO 22GX1.5 SAFETY (NEEDLE) IMPLANT
NS IRRIG 500ML POUR BTL (IV SOLUTION) IMPLANT
PACK BASIN MINOR ARMC (MISCELLANEOUS) IMPLANT
RETRACTOR RING XSMALL (MISCELLANEOUS) IMPLANT
RETRACTOR WOUND ALXS 18CM SML (MISCELLANEOUS) IMPLANT
RTRCTR WOUND ALEXIS 13CM XS SH (MISCELLANEOUS)
RTRCTR WOUND ALEXIS O 18CM SML (MISCELLANEOUS)
SPONGE LAP 18X18 RF (DISPOSABLE) IMPLANT
SUT MNCRL AB 4-0 PS2 18 (SUTURE) IMPLANT
SUT VIC AB 2-0 CT1 27 (SUTURE)
SUT VIC AB 2-0 CT1 TAPERPNT 27 (SUTURE) IMPLANT
SUT VIC AB 2-0 UR6 27 (SUTURE) IMPLANT
SYR 10ML LL (SYRINGE) IMPLANT
TRAY FOLEY MTR SLVR 16FR STAT (SET/KITS/TRAYS/PACK) IMPLANT

## 2018-11-21 NOTE — Anesthesia Postprocedure Evaluation (Signed)
Anesthesia Post Note  Patient: Nicole Craig  Procedure(s) Performed: AN AD HOC LABOR EPIDURAL  Patient location during evaluation: Mother Baby Anesthesia Type: Epidural Level of consciousness: oriented and awake and alert Pain management: pain level controlled Vital Signs Assessment: post-procedure vital signs reviewed and stable Respiratory status: spontaneous breathing and respiratory function stable Cardiovascular status: blood pressure returned to baseline and stable Postop Assessment: no headache, no backache, no apparent nausea or vomiting and able to ambulate Anesthetic complications: no Comments: Patient reports some sensory deficits inconsistent with epidural complications: discomfort/numbness  in left groin. Patient ambulatory. Patients complaints discussed with Dr. Randa Lynn. No follow-up needed     Last Vitals:  Vitals:   11/21/18 0815 11/21/18 0839  BP: 119/72 120/78  Pulse: 87 93  Resp: 19 20  Temp: 36.7 C 36.7 C  SpO2:  100%    Last Pain:  Vitals:   11/21/18 0839  TempSrc: Oral  PainSc:                  Alison Stalling

## 2018-11-21 NOTE — Progress Notes (Signed)
Discharge instructions/ follow up visits reviewed with patient who verbalized understanding. Denies any questions. Discharged home with infant.
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
## 2018-11-21 NOTE — Progress Notes (Signed)
Post Partum Day 1 Subjective: Doing well, no complaints.  Tolerating regular diet, pain with PO meds, voiding and ambulating without difficulty.  No CP SOB Fever,Chills, N/V or leg pain; denies nipple or breast pain, no HA change of vision, RUQ/epigastric pain  Objective: BP 120/78 (BP Location: Right Arm)   Pulse 93   Temp 98 F (36.7 C) (Oral)   Resp 20   Ht 5\' 3"  (1.6 m)   Wt 101.2 kg   LMP 02/12/2018 (Exact Date)   SpO2 100%   Breastfeeding Unknown   BMI 39.50 kg/m    Physical Exam:  General: NAD Breasts: soft/nontender CV: RRR Pulm: nl effort, CTABL Abdomen: soft, NT, BS x 4 Perineum: minimal edema, intact Lochia: moderate Uterine Fundus: fundus firm and 2 fb below umbilicus DVT Evaluation: no cords, ttp LEs   Recent Labs    11/20/18 1926 11/21/18 0855  HGB 10.0* 10.4*  HCT 30.8* 31.7*  WBC 11.8* 10.8*  PLT 137* 165    Assessment/Plan: 33 y.o. [id number] postpartum day # 1  - Continue routine PP care - encouraged snug fitting bra and cabbage leaves for bottlefeeding.  - Discussed contraceptive options including implant, IUDs hormonal and non-hormonal, injection, pills/ring/patch, condoms, and NFP. Pt has changed her mind about BTL and desires IUD - Immunization status: all Imms up to date    Disposition: Does desire Dc home today.     Francetta Found, CNM 11/21/2018  9:49 AM

## 2018-11-25 ENCOUNTER — Ambulatory Visit: Payer: Medicaid Other

## 2018-12-04 NOTE — Discharge Summary (Signed)
Patient's last menstrual period was 02/12/2018 (exact date). EDC: Estimated Date of Delivery: 11/19/18 EGA: [redacted]w[redacted]d  Patient presented for evaluation of labor.  Patient had cervical exam by RN and this was reported to me. I reviewed her vital signs and fetal tracing, both of which were reassuring.  Patient was discharged as she was not laboring.  NST interpretation: Reactive.  Larey Days, MD Attending Obstetrician and Gynecologist [facility]

## 2019-01-27 ENCOUNTER — Ambulatory Visit: Payer: Medicaid Other

## 2019-01-28 ENCOUNTER — Ambulatory Visit: Payer: Medicaid Other

## 2019-02-02 ENCOUNTER — Other Ambulatory Visit: Payer: Self-pay

## 2019-02-02 ENCOUNTER — Other Ambulatory Visit: Payer: Self-pay | Admitting: Family Medicine

## 2019-02-02 ENCOUNTER — Ambulatory Visit: Payer: Medicaid Other | Admitting: Family Medicine

## 2019-02-02 ENCOUNTER — Encounter: Payer: Self-pay | Admitting: Family Medicine

## 2019-02-02 DIAGNOSIS — Z113 Encounter for screening for infections with a predominantly sexual mode of transmission: Secondary | ICD-10-CM

## 2019-02-02 DIAGNOSIS — F17210 Nicotine dependence, cigarettes, uncomplicated: Secondary | ICD-10-CM

## 2019-02-02 DIAGNOSIS — O99810 Abnormal glucose complicating pregnancy: Secondary | ICD-10-CM

## 2019-02-02 DIAGNOSIS — O99012 Anemia complicating pregnancy, second trimester: Secondary | ICD-10-CM

## 2019-02-02 DIAGNOSIS — Z3480 Encounter for supervision of other normal pregnancy, unspecified trimester: Secondary | ICD-10-CM

## 2019-02-02 DIAGNOSIS — R4586 Emotional lability: Secondary | ICD-10-CM

## 2019-02-02 DIAGNOSIS — Z30430 Encounter for insertion of intrauterine contraceptive device: Secondary | ICD-10-CM

## 2019-02-02 DIAGNOSIS — Z3009 Encounter for other general counseling and advice on contraception: Secondary | ICD-10-CM | POA: Diagnosis not present

## 2019-02-02 LAB — PREGNANCY, URINE: Preg Test, Ur: NEGATIVE

## 2019-02-02 LAB — HEMOGLOBIN, FINGERSTICK: Hemoglobin: 12 g/dL (ref 11.1–15.9)

## 2019-02-02 MED ORDER — LEVONORGESTREL 20 MCG/24HR IU IUD
1.0000 | INTRAUTERINE_SYSTEM | Freq: Once | INTRAUTERINE | Status: AC
  Administered 2019-02-02: 1 via INTRAUTERINE

## 2019-02-02 NOTE — Progress Notes (Signed)
In for PP visit @ 10+ wks. S/p SVD 11/20/18; considering IUD Debera Lat, RN

## 2019-02-02 NOTE — Progress Notes (Signed)
Post Partum Exam  Nicole Craig is a 33 y.o. [id number] female who presents for a postpartum visit. She is 8 weeks postpartum following a spontaneous vaginal delivery. I have fully reviewed the prenatal and intrapartum course. The delivery was at [redacted]w[redacted]d gestational weeks.  Anesthesia: epidural. Postpartum course has been uncomplicated. Baby's course has been uncomplicated. Baby is feeding by formula. Bleeding no bleeding. Bowel function is normal. Bladder function is normal. Patient is sexually active. Contraception method is IUD.  Condom used with sexually activey 2 weeks ago.   Postpartum depression screening: POSITIVE (Score 7) Edinburgh Postnatal Depression Scale - 02/02/19 1539      Edinburgh Postnatal Depression Scale:  In the Past 7 Days   I have been able to laugh and see the funny side of things.  0    I have looked forward with enjoyment to things.  1    I have blamed myself unnecessarily when things went wrong.  1    I have been anxious or worried for no good reason.  1    I have felt scared or panicky for no good reason.  1    Things have been getting on top of me.  1    I have been so unhappy that I have had difficulty sleeping.  0    I have felt sad or miserable.  1    I have been so unhappy that I have been crying.  1    The thought of harming myself has occurred to me.  0    Edinburgh Postnatal Depression Scale Total  7          The following portions of the patient's history were reviewed and updated as appropriate: allergies, current medications, past family history, past medical history, past social history, past surgical history and problem list. Last pap smear done 04/22/2018 and was Normal  Review of Systems Pertinent items are noted in HPI.    Objective:  BP 113/78   Ht 5\' 4"  (1.626 m)   Wt 225 lb (102.1 kg)   LMP 01/12/2019 (Exact Date)   Breastfeeding No   BMI 38.62 kg/m   General:  alert, cooperative and appears stated age   Breasts:  formula  feeding, no breast concerns  Lungs: Normal WOB  Heart:  regular rate and rhythm, S1, S2 normal, no murmur, click, rub or gallop  Abdomen: soft, non-tender; bowel sounds normal; no masses,  no organomegaly   Vulva:  normal  Vagina: normal vagina  Cervix:  multiparous appearance  Corpus: normal  Adnexa:  normal adnexa  Rectal Exam: Not performed.            Patient presented to [facility] for IUD insertion. Her GC/CT screening was found to be up to date and a pregnancy test was obtained. Urine pregnancy test  today was Negative.  See Flowsheet for IUD check list  IUD Insertion Procedure Note Patient identified, informed consent performed, consent signed.   Discussed risks of irregular bleeding, cramping, infection, malpositioning or misplacement of the IUD outside the uterus which may require further procedure such as laparoscopy. Time out was performed.    Speculum placed in the vagina.  Cervix visualized.  Cleaned with Betadine x 2.  Grasped anteriorly with a single tooth tenaculum.  Uterus sounded to 8 cm.  IUD placed per manufacturer's recommendations.  Strings trimmed to 3 cm. Tenaculum was removed, good hemostasis noted.  Patient tolerated procedure well.   Patient was given post-procedure instructions- both agency handout  and verbally by provider.  She was advised to have backup contraception for one week.  Patient was also asked to check IUD strings periodically or follow up in 4 weeks for IUD check.  Assessment:    Normal postpartum exam. Pap smear not done at today's visit.   Plan:   1. Contraception: IUD 2. Infant feeding:  patient is currently feeding with formula only.    3. Mood: EPDS is low risk. Reviewed resources and that mood sx in first year after pregnancy are considered related to pregnancy and to reach out for help at [facility] if needed. Discussed [facility] as link to care and availability of LCSW for counseling. Accepts referral to [facility] LCSW today  4. Chronic Medical  Conditions:  Obesity- encouraged weight loss postpartum  Anemia - HGB today  H/o abnormal pap- last was NIL, neg HPV. Next pap in 2022 and pap history of updated in epic  Patient given handout about PCP care in the community Given MVI per family planning program  Orders Placed This Encounter  Procedures  . Syphilis Serology, [facility]  . HIV  LAB  . Ambulatory referral to Behavioral Health    Referral Priority:   Routine    Referral Type:   Psychiatric    Referral Reason:   Specialty Services Required    Requested Specialty:   Behavioral Health    Number of Visits Requested:   1    Follow up in: 3 weeks or as needed.

## 2019-02-17 ENCOUNTER — Ambulatory Visit: Payer: Medicaid Other | Admitting: Licensed Clinical Social Worker

## 2019-05-01 IMAGING — CT CT MAXILLOFACIAL W/ CM
3 series · 14 of 47 positions shown, 16 images · IV contrast (omnipaque)
Comparison: None.

CLINICAL DATA: Status post wisdom tooth extraction several days
ago. Facial swelling.

EXAM:
CT MAXILLOFACIAL WITH CONTRAST
TECHNIQUE: Multidetector CT imaging of the maxillofacial structures was
performed with intravenous contrast. Multiplanar CT image
reconstructions were also generated.
CONTRAST:  75mL OMNIPAQUE IOHEXOL 300 MG/ML  SOLN

[Series 2: max soft · axial · 0.33mm/px · z∈[-200,-76]mm · 8 of 74 slices shown, 10 images]
[im 6/74  brain]
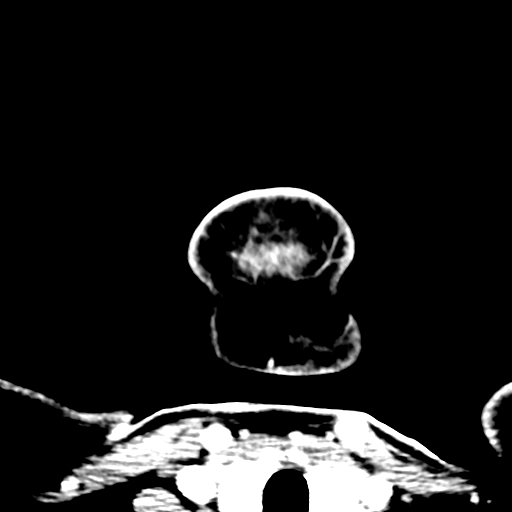
[im 6/74  bone]
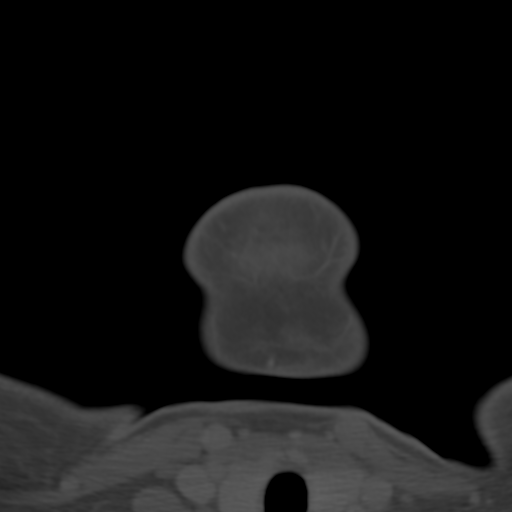
[im 16/74  bone]
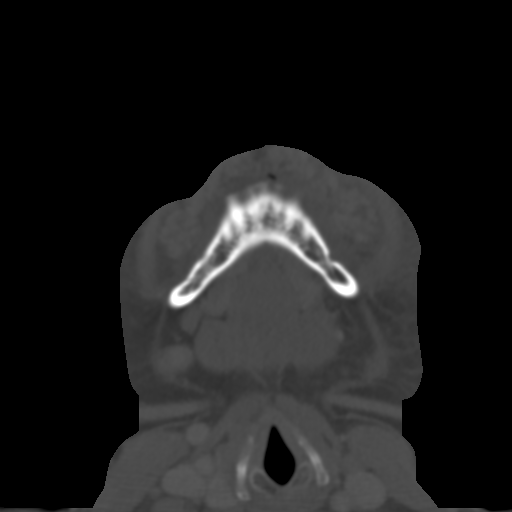
[im 23/74  bone]
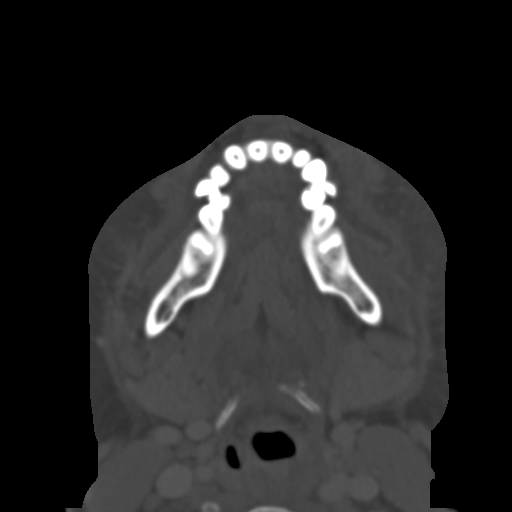
[im 33/74  bone]
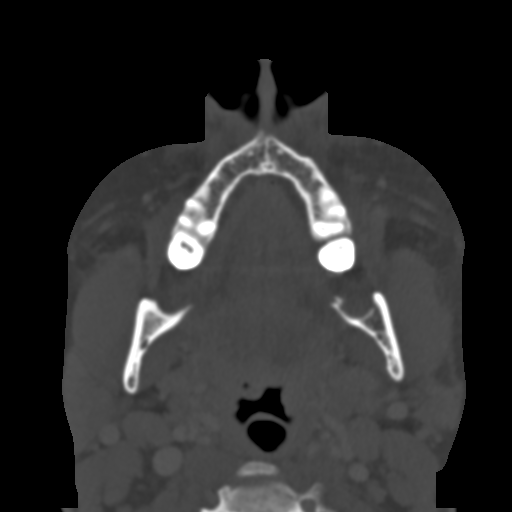
[im 41/74  brain]
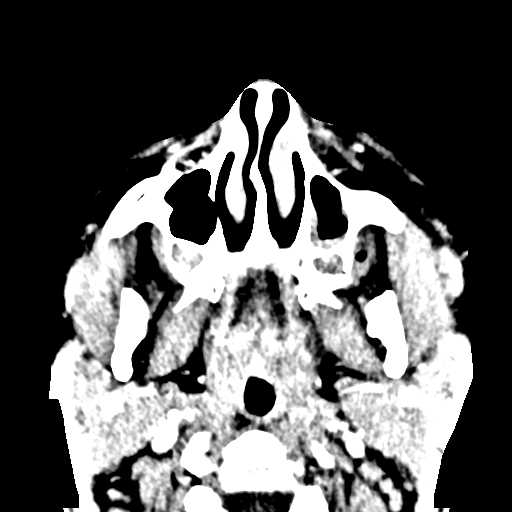
[im 41/74  bone]
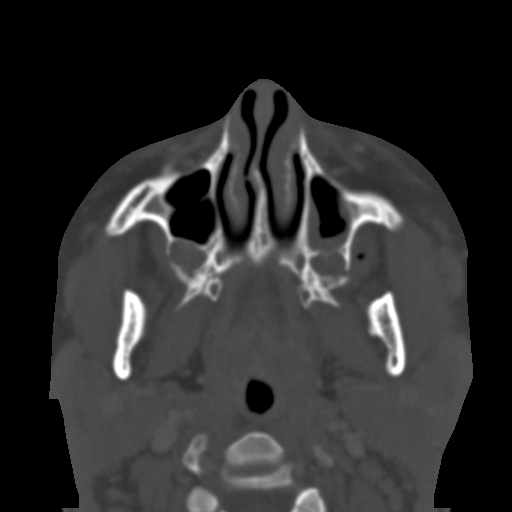
[im 51/74  bone]
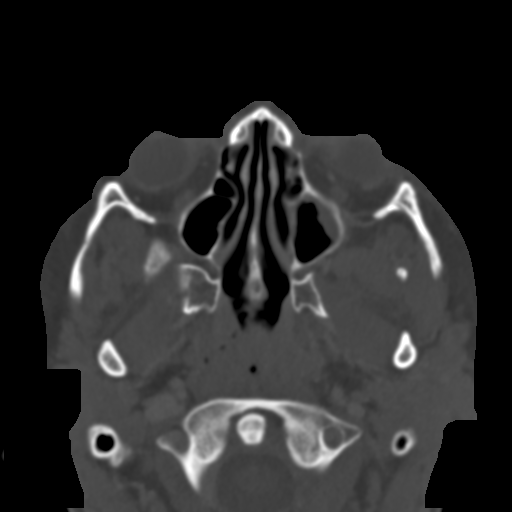
[im 58/74  bone]
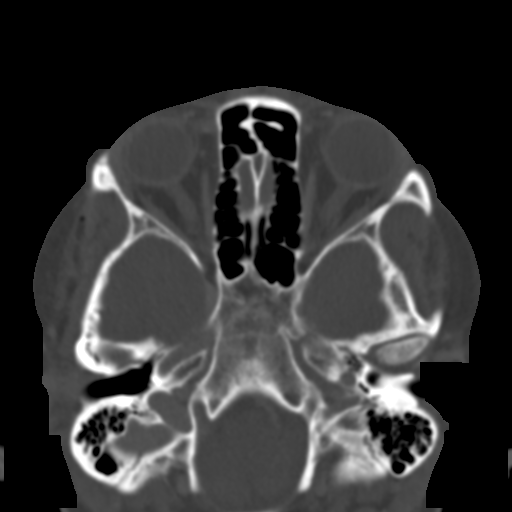
[im 68/74  bone]
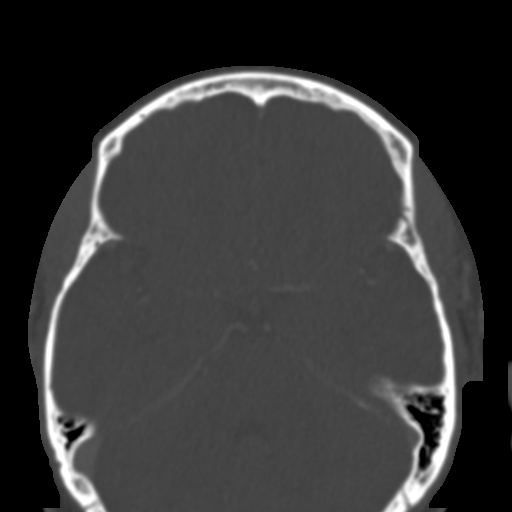

[Series 6: coronal soft · coronal · 0.28mm/px · 3 of 103 slices shown]
[im 35/103  bone]
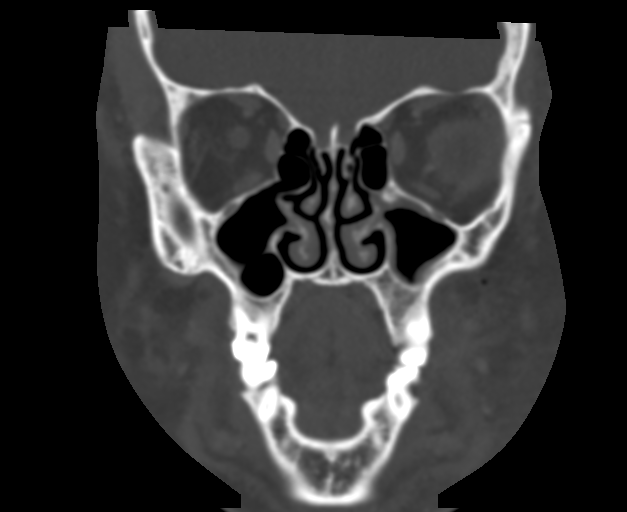
[im 46/103  bone]
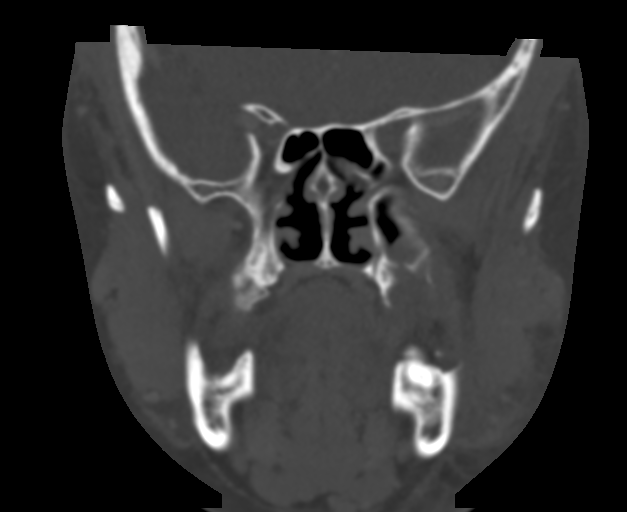
[im 57/103  bone]
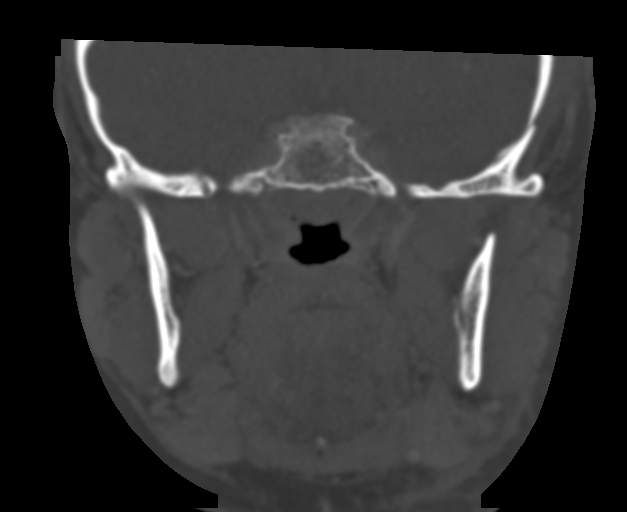

[Series 8: sagittal soft · sagittal · 0.28mm/px · 3 of 88 slices shown]
[im 30/88  bone]
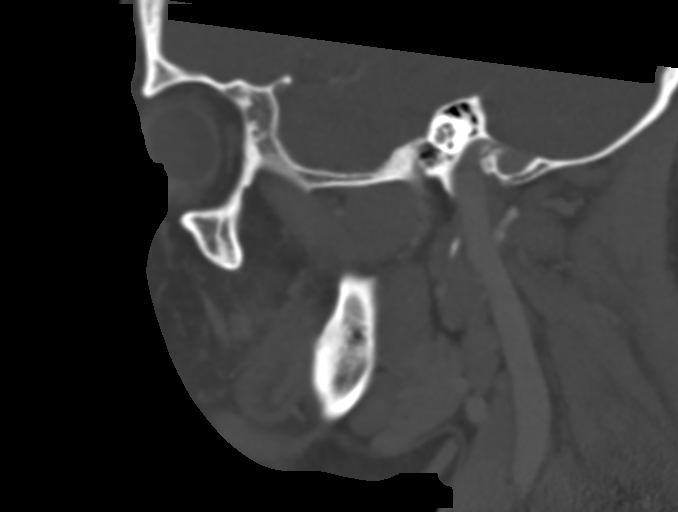
[im 44/88  bone]
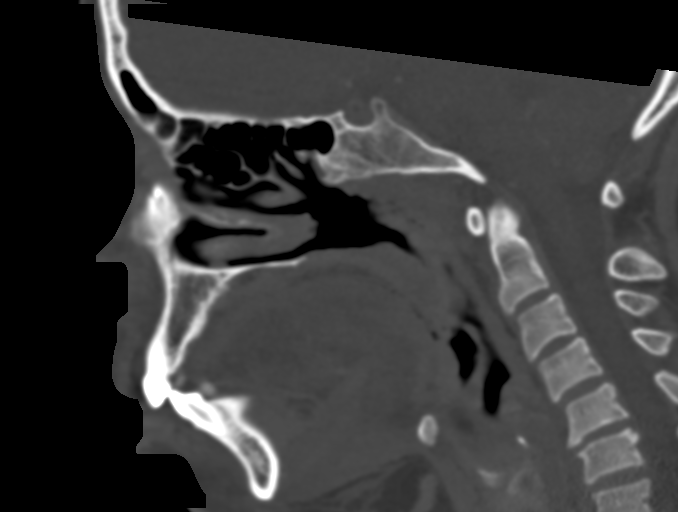
[im 59/88  bone]
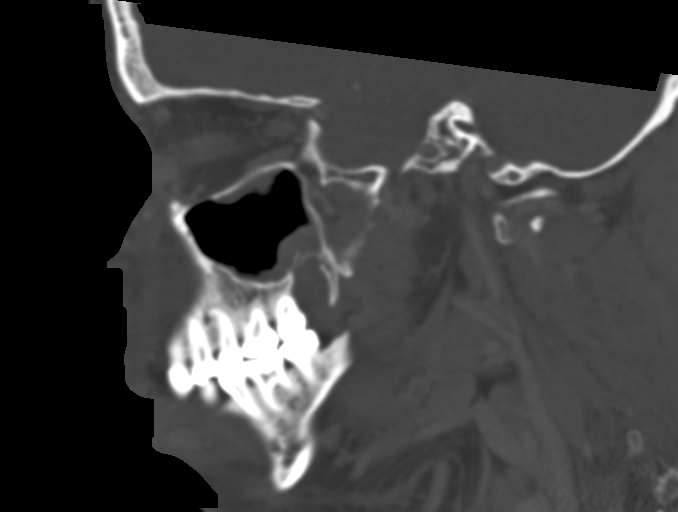

[14 of 47 positions shown; findings below may reference images not displayed]

FINDINGS: Osseous: The patient has had extraction upper and lower wisdom
teeth. There are BILATERAL lateral and posterior wall maxillary
fractures both at the site of tooth extraction socket, as well as
more superiorly involving the lateral wall the maxillary sinus.
There is fluid and air in the retroantral soft tissues, greater on
the LEFT. Slight cortical dehiscence of the LEFT but not RIGHT
medial mandible. No significant retained tooth fragments or roots.

Orbits: Negative. No traumatic or inflammatory finding.

Sinuses: Mucosal thickening in both maxillary sinuses, with layering
fluid on the LEFT. No ethmoid, sphenoid, or frontal sinus
abnormality.

Soft tissues: BILATERAL malar soft tissue swelling. No abscess.
There are hyperattenuating soft tissue nodules throughout the lower
face and lip, suggestive of lip Fillers.

Limited intracranial: Negative.
IMPRESSION: BILATERAL lateral and posterior wall maxillary fractures both at the
site of maxillary wisdom tooth extraction, as well as more
superiorly involving the maxillary sinuses.

Slight cortical dehiscence of the medial LEFT mandible, but not the
RIGHT.

Fluid and air in the retroantral soft tissues, greater on the LEFT.
Facial soft tissue swelling without abscess. Air-fluid level LEFT
maxillary sinus.

Radiodense abnormalities in the anterior lower face and lip, are
suspected to be unrelated, and are consistent with the appearance of
someone who has had lip Fillers.

## 2020-02-27 ENCOUNTER — Encounter: Payer: Self-pay | Admitting: Emergency Medicine

## 2020-02-27 ENCOUNTER — Emergency Department
Admission: EM | Admit: 2020-02-27 | Discharge: 2020-02-27 | Disposition: A | Discharge status: Home / Self Care | Payer: Medicaid Other | Attending: Emergency Medicine | Admitting: Emergency Medicine

## 2020-02-27 ENCOUNTER — Other Ambulatory Visit: Payer: Self-pay

## 2020-02-27 ENCOUNTER — Emergency Department: Payer: Medicaid Other

## 2020-02-27 DIAGNOSIS — G8929 Other chronic pain: Secondary | ICD-10-CM | POA: Diagnosis not present

## 2020-02-27 DIAGNOSIS — M25571 Pain in right ankle and joints of right foot: Secondary | ICD-10-CM | POA: Diagnosis not present

## 2020-02-27 DIAGNOSIS — F17210 Nicotine dependence, cigarettes, uncomplicated: Secondary | ICD-10-CM | POA: Insufficient documentation

## 2020-02-27 DIAGNOSIS — M25572 Pain in left ankle and joints of left foot: Secondary | ICD-10-CM | POA: Insufficient documentation

## 2020-02-27 MED ORDER — MELOXICAM 15 MG PO TABS: 15.0000 mg | ORAL_TABLET | Freq: Every day | ORAL | 0 refills | Status: DC
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
## 2020-02-27 NOTE — ED Triage Notes (Signed)
Pt reports for the last 2 days when she walks it feels like a bone is sticking in her left ankle and her right ankle also hurts.

## 2020-02-27 NOTE — Discharge Instructions (Addendum)
Please use the anti-inflammatory prescribed. You may also use tylenol, 500mg  every 4-6 hours as needed in addition to the medicine prescribed.

## 2020-03-11 ENCOUNTER — Ambulatory Visit: Payer: Medicaid Other

## 2020-03-11 ENCOUNTER — Other Ambulatory Visit: Payer: Self-pay

## 2020-03-18 ENCOUNTER — Ambulatory Visit: Payer: Medicaid Other

## 2020-03-24 ENCOUNTER — Ambulatory Visit: Payer: Medicaid Other

## 2020-08-31 ENCOUNTER — Ambulatory Visit: Payer: Medicaid Other

## 2022-11-03 ENCOUNTER — Ambulatory Visit
Admission: EM | Admit: 2022-11-03 | Discharge: 2022-11-03 | Disposition: A | Discharge status: Home / Self Care | Payer: Medicaid Other | Attending: Family Medicine | Admitting: Family Medicine

## 2022-11-03 DIAGNOSIS — J020 Streptococcal pharyngitis: Secondary | ICD-10-CM | POA: Diagnosis present

## 2022-11-03 LAB — GROUP A STREP BY PCR: Group A Strep by PCR: DETECTED — AB

## 2022-11-03 MED ORDER — AMOXICILLIN 500 MG PO CAPS: 1000.0000 mg | ORAL_CAPSULE | Freq: Every day | ORAL | 0 refills | Status: AC

## 2022-11-03 NOTE — Discharge Instructions (Signed)
You have strep. Be sure to throw away your toothbrush and get a new one. See handout for more information. Take Tylenol and Motrin as needed for pain.

## 2022-11-03 NOTE — ED Triage Notes (Signed)
3 days of sore throat and mild cough.
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
## 2023-01-21 ENCOUNTER — Ambulatory Visit
Admission: EM | Admit: 2023-01-21 | Discharge: 2023-01-21 | Disposition: A | Discharge status: Home / Self Care | Payer: Medicaid Other | Attending: Family Medicine | Admitting: Family Medicine

## 2023-01-21 DIAGNOSIS — N3001 Acute cystitis with hematuria: Secondary | ICD-10-CM | POA: Diagnosis present

## 2023-01-21 DIAGNOSIS — N760 Acute vaginitis: Secondary | ICD-10-CM | POA: Diagnosis present

## 2023-01-21 DIAGNOSIS — B9689 Other specified bacterial agents as the cause of diseases classified elsewhere: Secondary | ICD-10-CM | POA: Diagnosis present

## 2023-01-21 DIAGNOSIS — B3731 Acute candidiasis of vulva and vagina: Secondary | ICD-10-CM

## 2023-01-21 DIAGNOSIS — Z202 Contact with and (suspected) exposure to infections with a predominantly sexual mode of transmission: Secondary | ICD-10-CM | POA: Diagnosis present

## 2023-01-21 LAB — URINALYSIS, W/ REFLEX TO CULTURE (INFECTION SUSPECTED)
Bilirubin Urine: NEGATIVE
Glucose, UA: NEGATIVE mg/dL
Ketones, ur: NEGATIVE mg/dL
Nitrite: POSITIVE — AB
Protein, ur: NEGATIVE mg/dL
Specific Gravity, Urine: 1.02 (ref 1.005–1.030)
pH: 6 (ref 5.0–8.0)

## 2023-01-21 LAB — WET PREP, GENITAL
Sperm: NONE SEEN
Trich, Wet Prep: NONE SEEN
WBC, Wet Prep HPF POC: <10 (ref <10)

## 2023-01-21 MED ORDER — NITROFURANTOIN MONOHYD MACRO 100 MG PO CAPS: 100.0000 mg | ORAL_CAPSULE | Freq: Two times a day (BID) | ORAL | 0 refills | Status: DC

## 2023-01-21 MED ORDER — METRONIDAZOLE 500 MG PO TABS: 500.0000 mg | ORAL_TABLET | Freq: Two times a day (BID) | ORAL | 0 refills | Status: DC

## 2023-01-21 MED ORDER — FLUCONAZOLE 150 MG PO TABS: 150.0000 mg | ORAL_TABLET | ORAL | 0 refills | Status: AC
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
## 2023-01-21 NOTE — ED Triage Notes (Signed)
Sx started Sat.   Vaginal irritation,pressure when urination. Patient wants to be checked for STD's swab and blood.

## 2023-01-21 NOTE — Discharge Instructions (Signed)
You had evidence of of a bacterial and yeast infection today.  Stop by the pharmacy to pick up your prescriptions.  For your UTI: Take Macrobid times a day for the next 5 days  For BV/bacterial vaginosis: Take metronidazole twice a day for the next 7 days.  Do not drink any alcohol with taking this medication.  For yeast infection: Take the first dose of Diflucan on day 3 and after you complete your antibiotics take the last dose.  If your symptoms do not improve in the next 7 days, be sure to follow-up here or at your primary care provider office.  Go to the emergency department if you are having increasing pain, worsening vaginal bleeding or fever.

## 2023-01-23 LAB — URINE CULTURE: Culture: >=100000 — AB

## 2023-04-16 ENCOUNTER — Ambulatory Visit
Admission: EM | Admit: 2023-04-16 | Discharge: 2023-04-16 | Disposition: A | Discharge status: Home / Self Care | Payer: Medicaid Other | Attending: Emergency Medicine | Admitting: Emergency Medicine

## 2023-04-16 DIAGNOSIS — B9689 Other specified bacterial agents as the cause of diseases classified elsewhere: Secondary | ICD-10-CM | POA: Insufficient documentation

## 2023-04-16 DIAGNOSIS — N760 Acute vaginitis: Secondary | ICD-10-CM | POA: Insufficient documentation

## 2023-04-16 DIAGNOSIS — N390 Urinary tract infection, site not specified: Secondary | ICD-10-CM | POA: Diagnosis present

## 2023-04-16 DIAGNOSIS — H1032 Unspecified acute conjunctivitis, left eye: Secondary | ICD-10-CM | POA: Diagnosis present

## 2023-04-16 LAB — URINALYSIS, W/ REFLEX TO CULTURE (INFECTION SUSPECTED)
Bilirubin Urine: NEGATIVE
Glucose, UA: NEGATIVE mg/dL
Hgb urine dipstick: NEGATIVE
Nitrite: NEGATIVE
Protein, ur: NEGATIVE mg/dL
Specific Gravity, Urine: 1.025 (ref 1.005–1.030)
pH: 6.5 (ref 5.0–8.0)

## 2023-04-16 LAB — WET PREP, GENITAL
Sperm: NONE SEEN
Trich, Wet Prep: NONE SEEN
WBC, Wet Prep HPF POC: >10 — AB (ref <10)
Yeast Wet Prep HPF POC: NONE SEEN

## 2023-04-16 MED ORDER — MOXIFLOXACIN HCL 0.5 % OP SOLN: 1.0000 [drp] | Freq: Three times a day (TID) | OPHTHALMIC | 0 refills | Status: AC

## 2023-04-16 MED ORDER — NITROFURANTOIN MONOHYD MACRO 100 MG PO CAPS: 100.0000 mg | ORAL_CAPSULE | Freq: Two times a day (BID) | ORAL | 0 refills | Status: DC

## 2023-04-16 MED ORDER — PHENAZOPYRIDINE HCL 200 MG PO TABS: 200.0000 mg | ORAL_TABLET | Freq: Three times a day (TID) | ORAL | 0 refills | Status: AC

## 2023-04-16 MED ORDER — METRONIDAZOLE 500 MG PO TABS: 500.0000 mg | ORAL_TABLET | Freq: Two times a day (BID) | ORAL | 0 refills | Status: AC
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
## 2023-04-16 NOTE — Discharge Instructions (Addendum)
Take the Flagyl (metronidazole) 500 mg twice daily for treatment of your bacterial vaginosis.  Avoid alcohol while on the metronidazole as taken together will cause of vomiting.  Bacterial vaginosis is often caused by a imbalance of bacteria in your vaginal vault.  This is sometimes a result of using tampons or hormonal fluctuations during her menstrual cycle.  You if your symptoms are recurrent you can try using a boric acid suppository twice weekly to help maintain the acid-base balance in your vagina vault which could prevent further infection.  You can also try vaginal probiotics to help return normal bacterial balance.   Take the Macrobid twice daily for 5 days with food for treatment of urinary tract infection.  Use the Pyridium every 8 hours as needed for urinary discomfort.  This will turn your urine a bright red-orange.  Increase your oral fluid intake so that you increase your urine production and or flushing your urinary system.  Take an over-the-counter probiotic, such as Culturelle-Align-Activia, 1 hour after each dose of antibiotic to prevent diarrhea or yeast infections from forming.  We will culture urine and change the antibiotics if necessary.  Instill 1 drop of Vigamox in each eye every 8 hours for the next 7 days for treatment of your conjunctivitis.  Avoid touching your eyes as much as possible.  Wipe down all surfaces, countertops, and doorknobs after the first and second 24 hours on eyedrops.  Wash her face with a clean wash rag to remove any drainage and use a different portion of the wash rag to clean each eye so as to not reinfect yourself.  Your STI testing will be back in the next 1 to 2 days.  If you test positive for any infection she will be contacted by phone and treatment options will be made available.  If your results are negative they will appear in your MyChart.  Return for reevaluation for any new or worsening symptoms.
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
## 2023-04-16 NOTE — ED Notes (Signed)
Discharged by Feliz Beam RN

## 2023-04-17 LAB — CERVICOVAGINAL ANCILLARY ONLY
Chlamydia: NEGATIVE
Comment: NEGATIVE
Comment: NEGATIVE
Comment: NORMAL
Neisseria Gonorrhea: NEGATIVE
Trichomonas: NEGATIVE

## 2023-08-13 ENCOUNTER — Ambulatory Visit (LOCAL_COMMUNITY_HEALTH_CENTER): Payer: Self-pay

## 2023-08-13 ENCOUNTER — Other Ambulatory Visit: Payer: Self-pay

## 2023-08-13 DIAGNOSIS — Z111 Encounter for screening for respiratory tuberculosis: Secondary | ICD-10-CM

## 2023-08-15 ENCOUNTER — Ambulatory Visit (LOCAL_COMMUNITY_HEALTH_CENTER)

## 2023-08-15 DIAGNOSIS — Z111 Encounter for screening for respiratory tuberculosis: Secondary | ICD-10-CM

## 2023-08-15 LAB — TB SKIN TEST
Induration: 0 mm
TB Skin Test: NEGATIVE

## 2023-08-16 ENCOUNTER — Ambulatory Visit

## 2023-09-11 ENCOUNTER — Encounter: Payer: Self-pay | Admitting: Emergency Medicine

## 2023-09-11 ENCOUNTER — Ambulatory Visit
Admission: EM | Admit: 2023-09-11 | Discharge: 2023-09-11 | Disposition: A | Discharge status: Home / Self Care | Attending: Family Medicine | Admitting: Family Medicine

## 2023-09-11 DIAGNOSIS — N3001 Acute cystitis with hematuria: Secondary | ICD-10-CM

## 2023-09-11 LAB — URINALYSIS, W/ REFLEX TO CULTURE (INFECTION SUSPECTED)
Bilirubin Urine: NEGATIVE
Glucose, UA: NEGATIVE mg/dL
Nitrite: NEGATIVE
Protein, ur: 30 mg/dL — AB
Specific Gravity, Urine: 1.02 (ref 1.005–1.030)
WBC, UA: >50 WBC/hpf (ref 0–5)
pH: 6 (ref 5.0–8.0)

## 2023-09-11 MED ORDER — NITROFURANTOIN MONOHYD MACRO 100 MG PO CAPS: 100.0000 mg | ORAL_CAPSULE | Freq: Two times a day (BID) | ORAL | 0 refills | Status: AC
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
## 2023-09-11 NOTE — Discharge Instructions (Addendum)
 Your vaginal swab test results will be available in the next 72 hours. If positive, someone will contact you.  You should see your results in your MyChart account.   You have a urinary tract infection.  Stop by the pharmacy to pick up your prescriptions.  Follow up with your primary care provider or return to the urgent care, if not improving.

## 2023-09-11 NOTE — ED Triage Notes (Signed)
 Pt presents with urinary frequency, dysuria, vaginal discharge and odor x 2 days.

## 2023-09-13 ENCOUNTER — Telehealth (HOSPITAL_COMMUNITY): Payer: Self-pay

## 2023-09-13 LAB — CERVICOVAGINAL ANCILLARY ONLY
Bacterial Vaginitis (gardnerella): POSITIVE — AB
Candida Glabrata: NEGATIVE
Candida Vaginitis: NEGATIVE
Chlamydia: NEGATIVE
Comment: NEGATIVE
Comment: NEGATIVE
Comment: NEGATIVE
Comment: NEGATIVE
Comment: NEGATIVE
Comment: NORMAL
Neisseria Gonorrhea: NEGATIVE
Trichomonas: NEGATIVE

## 2023-09-13 LAB — URINE CULTURE: Culture: >=100000 — AB

## 2023-09-13 MED ORDER — METRONIDAZOLE 500 MG PO TABS: 500.0000 mg | ORAL_TABLET | Freq: Two times a day (BID) | ORAL | 0 refills | Status: AC

## 2023-09-13 MED ORDER — SULFAMETHOXAZOLE-TRIMETHOPRIM 800-160 MG PO TABS: 1.0000 | ORAL_TABLET | Freq: Two times a day (BID) | ORAL | 0 refills | Status: AC

## 2023-09-13 NOTE — Telephone Encounter (Signed)
 Per protocol, pt requires tx with metronidazole. Rx sent to pharmacy on file.

## 2023-09-13 NOTE — Addendum Note (Signed)
 Addended by: Sharilyn Davenport on: 09/13/2023 01:29 PM   Modules accepted: Orders

## 2023-09-13 NOTE — Telephone Encounter (Signed)
 Per protocol, pt to dc Macrobid  and begin treatment with Bactrim.  Attempted to reach patient x1. VM full.

## 2023-10-14 ENCOUNTER — Ambulatory Visit
Admission: EM | Admit: 2023-10-14 | Discharge: 2023-10-14 | Disposition: A | Discharge status: Home / Self Care | Attending: Family Medicine | Admitting: Family Medicine

## 2023-10-14 ENCOUNTER — Ambulatory Visit (INDEPENDENT_AMBULATORY_CARE_PROVIDER_SITE_OTHER)

## 2023-10-14 DIAGNOSIS — M25561 Pain in right knee: Secondary | ICD-10-CM

## 2023-10-14 MED ORDER — CYCLOBENZAPRINE HCL 5 MG PO TABS
5.0000 mg | ORAL_TABLET | Freq: Three times a day (TID) | ORAL | 0 refills | Status: AC | PRN
Start: 2023-10-14 — End: ?

## 2023-10-14 MED ORDER — NAPROXEN 500 MG PO TABS: 500.0000 mg | ORAL_TABLET | Freq: Two times a day (BID) | ORAL | 0 refills | Status: AC
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
## 2023-10-14 NOTE — Discharge Instructions (Addendum)
 If medication was prescribed, stop by the pharmacy to pick up your prescriptions.  For your  pain, Take 1000 mg Tylenol  twice a day, take muscle relaxer, Naprosyn twice a day,  as needed for pain. Wear the knee brace. Rest and elevate the affected painful area.  Apply cold compresses intermittently, as needed.  As pain recedes, begin normal activities slowly as tolerated.  Follow up with an orthopedic provider for further evaluation. You may need a CT or MRI to evaluate for internal injury of your knee.

## 2023-10-14 NOTE — ED Triage Notes (Signed)
 Pt c/o R leg pain d/t fall yesterday. States she slipped on the grass while chasing dog.
